# Patient Record
Sex: Female | Born: 1945 | Race: White | Hispanic: No | Smoking: Former smoker
Health system: Southern US, Community
[De-identification: ages and names within clinical notes are randomized; demographics above are authoritative.]

## PROBLEM LIST (undated history)

## (undated) DIAGNOSIS — I10 Essential (primary) hypertension: Secondary | ICD-10-CM

## (undated) DIAGNOSIS — C801 Malignant (primary) neoplasm, unspecified: Secondary | ICD-10-CM

## (undated) DIAGNOSIS — F909 Attention-deficit hyperactivity disorder, unspecified type: Secondary | ICD-10-CM

## (undated) HISTORY — PX: OTHER SURGICAL HISTORY: SHX169

## (undated) HISTORY — PX: ABDOMINAL HYSTERECTOMY: SHX81

## (undated) HISTORY — PX: TOTAL HIP ARTHROPLASTY: SHX124

---

## 2012-11-08 DIAGNOSIS — M25559 Pain in unspecified hip: Secondary | ICD-10-CM | POA: Insufficient documentation

## 2014-10-15 DIAGNOSIS — M545 Low back pain, unspecified: Secondary | ICD-10-CM | POA: Insufficient documentation

## 2014-10-15 DIAGNOSIS — G8929 Other chronic pain: Secondary | ICD-10-CM | POA: Insufficient documentation

## 2015-03-30 ENCOUNTER — Emergency Department: Payer: Medicare Other

## 2015-03-30 ENCOUNTER — Other Ambulatory Visit: Payer: Self-pay

## 2015-03-30 ENCOUNTER — Encounter: Payer: Self-pay | Admitting: Emergency Medicine

## 2015-03-30 DIAGNOSIS — R0602 Shortness of breath: Secondary | ICD-10-CM | POA: Insufficient documentation

## 2015-03-30 DIAGNOSIS — I10 Essential (primary) hypertension: Secondary | ICD-10-CM | POA: Insufficient documentation

## 2015-03-30 DIAGNOSIS — R918 Other nonspecific abnormal finding of lung field: Secondary | ICD-10-CM | POA: Diagnosis not present

## 2015-03-30 DIAGNOSIS — Z87891 Personal history of nicotine dependence: Secondary | ICD-10-CM | POA: Insufficient documentation

## 2015-03-30 DIAGNOSIS — R0600 Dyspnea, unspecified: Secondary | ICD-10-CM | POA: Insufficient documentation

## 2015-03-30 LAB — CBC
HCT: 34.1 % — ABNORMAL LOW (ref 35.0–47.0)
HEMOGLOBIN: 11.1 g/dL — AB (ref 12.0–16.0)
MCH: 26.9 pg (ref 26.0–34.0)
MCHC: 32.5 g/dL (ref 32.0–36.0)
MCV: 82.7 fL (ref 80.0–100.0)
PLATELETS: 458 10*3/uL — AB (ref 150–440)
RBC: 4.12 MIL/uL (ref 3.80–5.20)
RDW: 17.9 % — ABNORMAL HIGH (ref 11.5–14.5)
WBC: 12.8 10*3/uL — AB (ref 3.6–11.0)

## 2015-03-30 LAB — BASIC METABOLIC PANEL
ANION GAP: 3 — AB (ref 5–15)
BUN: 23 mg/dL — ABNORMAL HIGH (ref 6–20)
CHLORIDE: 109 mmol/L (ref 101–111)
CO2: 25 mmol/L (ref 22–32)
Calcium: 8.3 mg/dL — ABNORMAL LOW (ref 8.9–10.3)
Creatinine, Ser: 1.04 mg/dL — ABNORMAL HIGH (ref 0.44–1.00)
GFR calc Af Amer: >60 mL/min (ref >60)
GFR calc non Af Amer: 53 mL/min — ABNORMAL LOW (ref >60)
Glucose, Bld: 79 mg/dL (ref 65–99)
Potassium: 3.6 mmol/L (ref 3.5–5.1)
SODIUM: 137 mmol/L (ref 135–145)

## 2015-03-30 LAB — BRAIN NATRIURETIC PEPTIDE: B Natriuretic Peptide: 126 pg/mL — ABNORMAL HIGH (ref 0.0–100.0)

## 2015-03-30 LAB — TROPONIN I: Troponin I: 0.03 ng/mL (ref <0.031)

## 2015-03-30 NOTE — ED Notes (Signed)
Pt reports she was diagnosed with Pneumonia about 1 week ago with worsening SOB and chest pain. Currently taking Levaquin for Pneumonia bilateral lungs per pt.

## 2015-03-31 ENCOUNTER — Encounter: Payer: Self-pay | Admitting: Radiology

## 2015-03-31 ENCOUNTER — Emergency Department
Admission: EM | Admit: 2015-03-31 | Discharge: 2015-03-31 | Disposition: A | Discharge status: Home / Self Care | Payer: Medicare Other | Attending: Emergency Medicine | Admitting: Emergency Medicine

## 2015-03-31 ENCOUNTER — Emergency Department: Payer: Medicare Other

## 2015-03-31 DIAGNOSIS — R0602 Shortness of breath: Secondary | ICD-10-CM | POA: Diagnosis not present

## 2015-03-31 DIAGNOSIS — R0600 Dyspnea, unspecified: Secondary | ICD-10-CM

## 2015-03-31 DIAGNOSIS — R918 Other nonspecific abnormal finding of lung field: Secondary | ICD-10-CM

## 2015-03-31 HISTORY — DX: Attention-deficit hyperactivity disorder, unspecified type: F90.9

## 2015-03-31 HISTORY — DX: Essential (primary) hypertension: I10

## 2015-03-31 LAB — FIBRIN DERIVATIVES D-DIMER (ARMC ONLY): Fibrin derivatives D-dimer (ARMC): 1498 — ABNORMAL HIGH (ref 0–499)

## 2015-03-31 MED ORDER — IOHEXOL 350 MG/ML SOLN
75.0000 mL | Freq: Once | INTRAVENOUS | Status: AC | PRN
  Administered 2015-03-31: 75 mL via INTRAVENOUS

## 2015-03-31 MED ORDER — IOHEXOL 300 MG/ML  SOLN: 75.0000 mL | Freq: Once | INTRAMUSCULAR | Status: DC | PRN

## 2015-03-31 MED ORDER — KETOROLAC TROMETHAMINE 30 MG/ML IJ SOLN
30.0000 mg | Freq: Once | INTRAMUSCULAR | Status: AC
  Administered 2015-03-31: 30 mg via INTRAVENOUS

## 2015-03-31 MED ORDER — SODIUM CHLORIDE 0.9 % IV BOLUS (SEPSIS)
1000.0000 mL | Freq: Once | INTRAVENOUS | Status: AC
  Administered 2015-03-31: 1000 mL via INTRAVENOUS

## 2015-03-31 MED ORDER — KETOROLAC TROMETHAMINE 30 MG/ML IJ SOLN
INTRAMUSCULAR | Status: AC
  Filled 2015-03-31: qty 1

## 2015-03-31 NOTE — Discharge Instructions (Signed)

## 2015-03-31 NOTE — ED Notes (Signed)
Pt provided with extra blankets for comfort. Call bell at side.

## 2015-03-31 NOTE — ED Provider Notes (Signed)
[facility] Emergency Department Provider Note  ____________________________________________  Time seen: Approximately 0055 AM  I have reviewed the triage vital signs and the nursing notes.   HISTORY  Chief Complaint Shortness of Breath   HPI Marie Dunn is a 69 y.o. female who comes in with a double pneumonia 10 days. The patient reports she did around of antibiotics and then did another round of antibiotics. The patient reports that she was told by her physician if the symptoms did not improve that she should come into the emergency department. The patient reports she is worsened over the last couple of days. She had fever cough and been unable to get out of bed. The patient does not remit name of the medicine that she received initially but her last course of antibiotics was level floxacillin. The patient reports that she was seen at the [facility] practice for treatment. She reports that she also hurts all over. She denies any chest pain but says she is unable to breathe well. She has not taken her temperature home but reports it was 99 at the doctor's office. The patient came in for further treatment and evaluation.   Past Medical History  Diagnosis Date  . ADHD (attention deficit hyperactivity disorder)   . Hypertension     There are no active problems to display for this patient.   Past Surgical History  Procedure Laterality Date  . Abdominal hysterectomy    . Left hip replacement    . Left ankle surgery      No current outpatient prescriptions on file.  Allergies Ampicillin  No family history on file.  Social History History  Substance Use Topics  . Smoking status: Former Research scientist (life sciences)  . Smokeless tobacco: Never Used  . Alcohol Use: No    Review of Systems Constitutional: fever/chills Eyes: No visual changes. ENT: No sore throat. Cardiovascular: Denies chest pain. Respiratory:  shortness of breath. Gastrointestinal: Nausea with No  abdominal pain.   no vomiting.   Genitourinary: Negative for dysuria. Musculoskeletal: Negative for back pain. Skin: Negative for rash. Neurological: Negative for headaches,  10-point ROS otherwise negative.  ____________________________________________   PHYSICAL EXAM:  VITAL SIGNS: ED Triage Vitals  Enc Vitals Group     BP 03/30/15 1905 113/69 mmHg     Pulse Rate 03/30/15 1905 101     Resp 03/30/15 1905 26     Temp 03/30/15 1905 98.2 F (36.8 C)     Temp Source 03/30/15 1905 Oral     SpO2 03/30/15 1905 96 %     Weight 03/30/15 1905 140 lb (63.504 kg)     Height 03/30/15 1905 '5\' 3"'$  (1.6 m)     Head Cir --      Peak Flow --      Pain Score 03/30/15 1906 10     Pain Loc --      Pain Edu? --      Excl. in [place]? --     Constitutional: Alert and oriented. Well appearing and in mild distress. Eyes: Conjunctivae are normal. PERRL. EOMI. Head: Atraumatic. Nose: No congestion/rhinnorhea. Mouth/Throat: Mucous membranes are moist.  Oropharynx non-erythematous. Cardiovascular: Normal rate, regular rhythm. Grossly normal heart sounds.  Good peripheral circulation. Respiratory: Normal respiratory effort.  No retractions. Crackles in bilateral bases Gastrointestinal: Soft and nontender. No distention. Positive bowel sounds Genitourinary: Deferred Musculoskeletal: No lower extremity tenderness nor edema.   Neurologic:  Normal speech and language. No gross focal neurologic deficits are appreciated.  No gait  instability. Skin:  Skin is warm, dry and intact. No rash noted. Psychiatric: Mood and affect are normal.   ____________________________________________   LABS (all labs ordered are listed, but only abnormal results are displayed)  Labs Reviewed  CBC - Abnormal; Notable for the following:    WBC 12.8 (*)    Hemoglobin 11.1 (*)    HCT 34.1 (*)    RDW 17.9 (*)    Platelets 458 (*)    All other components within normal limits  BASIC METABOLIC PANEL - Abnormal; Notable for the  following:    BUN 23 (*)    Creatinine, Ser 1.04 (*)    Calcium 8.3 (*)    GFR calc non Af Amer 53 (*)    Anion gap 3 (*)    All other components within normal limits  BRAIN NATRIURETIC PEPTIDE - Abnormal; Notable for the following:    B Natriuretic Peptide 126.0 (*)    All other components within normal limits  FIBRIN DERIVATIVES D-DIMER ([facility] ONLY) - Abnormal; Notable for the following:    Fibrin derivatives D-dimer ([facility]) 1498 (*)    All other components within normal limits  BLOOD GAS, VENOUS - Abnormal; Notable for the following:    pH, Ven 7.44 (*)    pCO2, Ven 35 (*)    All other components within normal limits  TROPONIN I   ____________________________________________  EKG  ED ECG REPORT I, Loney Hering, the attending physician, personally viewed and interpreted this ECG.   Date: 03/31/2015  EKG Time: 1924  Rate: 100  Rhythm: normal sinus rhythm  Axis: Normal  Intervals:none  ST&T Change: None  ____________________________________________  RADIOLOGY  Chest x-ray: Minimal bibasilar atelectasis or scarring, borderline cardiomegaly and mild changes of COPD  CT angiography chest: No evidence of significant pulmonary embolus, diffuse emphysematous changes and peripheral fibrosis/scarring in the lungs. 11 mm spiculated nodule in the inferior right upper lobe. Malignancy should be excluded ____________________________________________   PROCEDURES  Procedure(s) performed: None  Critical Care performed: No  ____________________________________________   INITIAL IMPRESSION / ASSESSMENT AND PLAN / ED COURSE  Pertinent labs & imaging results that were available during my care of the patient were reviewed by me and considered in my medical decision making (see chart for details).  This is a 69 year old female who comes in with pneumonia she reports for 10 days. I will look at the patient's old history to determine what medication she's been on and I will do  a CT of the patient's chest looking for other causes of her shortness of breath symptoms.  ----------------------------------------- 7:45 AM on 03/31/2015 -----------------------------------------  I did inform the patient of the results of the CT scan. The patient does not have any significantly elevated PCO2 she does not have a pneumonia on her x-ray. The patient does have some fibrosis with some mild basilar crackles but she does not have any  wheezes or other signs of COPD. I will discharge the patient home to have her follow back up with pulmonology to evaluate the cause of her shortness of breath. Otherwise the patient has no other concerns. Mom the emergency department her O2 sats have been in the mid to high 90s. I discussed this with the patient and she'll be discharged home. ____________________________________________   FINAL CLINICAL IMPRESSION(S) / ED DIAGNOSES  Final diagnoses:  SOB (shortness of breath)  Pulmonary mass  Dyspnea      Loney Hering, MD 03/31/15 [phone]

## 2015-03-31 NOTE — ED Notes (Signed)
Pt continues to sleep, resps unlabored.

## 2015-03-31 NOTE — ED Notes (Signed)
Spoke with son, states " I will be there in 45 min to come and get her".

## 2015-03-31 NOTE — ED Notes (Signed)
Pt sleeping. 

## 2015-03-31 NOTE — ED Notes (Signed)
Pt's son's phone number, [name], [phone]

## 2015-03-31 NOTE — ED Notes (Signed)
Pt states "i have double pneumonia again, i just know it, just don't do anything else, get the doctor in here in a hurry and get me upstairs." pt refusing to roll over and let rn listen to breath sounds, pt refusing to change into hospital gown. Pt's son in room, son states "mom, come on and do what the nurse is asking you to do, stop giving everyone such a hard time." pt then states "i'm being a bitch to you, i'm sorry". Pt then is cooperative with exam.

## 2015-03-31 NOTE — ED Notes (Signed)
Report to jennifer, rn.  

## 2015-04-03 LAB — BLOOD GAS, VENOUS
Acid-Base Excess: 0.1 mmol/L (ref 0.0–3.0)
BICARBONATE: 23.8 meq/L (ref 21.0–28.0)
FIO2: 0.21 %
PATIENT TEMPERATURE: 37
PH VEN: 7.44 — AB (ref 7.320–7.430)
pCO2, Ven: 35 mmHg — ABNORMAL LOW (ref 44.0–60.0)

## 2015-04-17 ENCOUNTER — Encounter: Payer: Self-pay | Admitting: Emergency Medicine

## 2015-04-17 ENCOUNTER — Emergency Department: Payer: Medicare Other

## 2015-04-17 ENCOUNTER — Emergency Department
Admission: EM | Admit: 2015-04-17 | Discharge: 2015-04-17 | Disposition: A | Discharge status: Home / Self Care | Payer: Medicare Other | Attending: Emergency Medicine | Admitting: Emergency Medicine

## 2015-04-17 DIAGNOSIS — Z87891 Personal history of nicotine dependence: Secondary | ICD-10-CM | POA: Insufficient documentation

## 2015-04-17 DIAGNOSIS — E876 Hypokalemia: Secondary | ICD-10-CM | POA: Insufficient documentation

## 2015-04-17 DIAGNOSIS — Z79899 Other long term (current) drug therapy: Secondary | ICD-10-CM | POA: Insufficient documentation

## 2015-04-17 DIAGNOSIS — I10 Essential (primary) hypertension: Secondary | ICD-10-CM | POA: Insufficient documentation

## 2015-04-17 DIAGNOSIS — IMO0001 Reserved for inherently not codable concepts without codable children: Secondary | ICD-10-CM

## 2015-04-17 DIAGNOSIS — R799 Abnormal finding of blood chemistry, unspecified: Secondary | ICD-10-CM | POA: Diagnosis present

## 2015-04-17 DIAGNOSIS — J441 Chronic obstructive pulmonary disease with (acute) exacerbation: Secondary | ICD-10-CM | POA: Diagnosis not present

## 2015-04-17 DIAGNOSIS — Z880 Allergy status to penicillin: Secondary | ICD-10-CM | POA: Insufficient documentation

## 2015-04-17 LAB — COMPREHENSIVE METABOLIC PANEL
ALK PHOS: 79 U/L (ref 38–126)
ALT: 58 U/L — AB (ref 14–54)
ANION GAP: 14 (ref 5–15)
AST: 80 U/L — AB (ref 15–41)
Albumin: 4 g/dL (ref 3.5–5.0)
BUN: 10 mg/dL (ref 6–20)
CO2: 25 mmol/L (ref 22–32)
Calcium: 10.1 mg/dL (ref 8.9–10.3)
Chloride: 101 mmol/L (ref 101–111)
Creatinine, Ser: 0.78 mg/dL (ref 0.44–1.00)
Glucose, Bld: 132 mg/dL — ABNORMAL HIGH (ref 65–99)
POTASSIUM: 2.9 mmol/L — AB (ref 3.5–5.1)
SODIUM: 140 mmol/L (ref 135–145)
TOTAL PROTEIN: 7.9 g/dL (ref 6.5–8.1)
Total Bilirubin: 0.5 mg/dL (ref 0.3–1.2)

## 2015-04-17 MED ORDER — POTASSIUM CHLORIDE CRYS ER 20 MEQ PO TBCR
40.0000 meq | EXTENDED_RELEASE_TABLET | Freq: Once | ORAL | Status: AC
Start: 2015-04-17 — End: 2015-04-17
  Administered 2015-04-17: 40 meq via ORAL

## 2015-04-17 MED ORDER — ALBUTEROL SULFATE HFA 108 (90 BASE) MCG/ACT IN AERS: 2.0000 | INHALATION_SPRAY | Freq: Four times a day (QID) | RESPIRATORY_TRACT | Status: DC | PRN

## 2015-04-17 MED ORDER — POTASSIUM CHLORIDE CRYS ER 20 MEQ PO TBCR
EXTENDED_RELEASE_TABLET | ORAL | Status: AC
  Administered 2015-04-17: 40 meq via ORAL
  Filled 2015-04-17: qty 2

## 2015-04-17 MED ORDER — POTASSIUM CHLORIDE ER 20 MEQ PO TBCR: 10.0000 meq | EXTENDED_RELEASE_TABLET | Freq: Every day | ORAL | Status: DC

## 2015-04-17 NOTE — Discharge Instructions (Signed)
Your potassium was 2.9 today. You were treated with 40 mEq of potassium in the emergency department by mouth. Take 20 mEq pill once a day for the next 5 days. You did not tolerate the albuterol nebulization treatments. We are prescribing albuterol MDI. Take 2-4 puffs as needed for any tightness. Use a spacer with the inhaler. Follow-up with your regular doctor for reexamination and ongoing care.  Chronic Asthmatic Bronchitis Chronic asthmatic bronchitis is a complication of persistent asthma. After a period of time with asthma, some people develop airflow obstruction that is present all the time, even when not having an asthma attack.There is also persistent inflammation of the airways, and the bronchial tubes produce more mucus. Chronic asthmatic bronchitis usually is a permanent problem with the lungs. CAUSES  Chronic asthmatic bronchitis happens most often in people who have asthma and also smoke cigarettes. Occasionally, it can happen to a person with long-standing or severe asthma even if the person is not a smoker. SIGNS AND SYMPTOMS  Chronic asthmatic bronchitis usually causes symptoms of both asthma and chronic bronchitis, including:   Coughing.  Increased sputum production.  Wheezing and shortness of breath.  Chest discomfort.  Recurring infections. DIAGNOSIS  Your health care provider will take a medical history and perform a physical exam. Chronic asthmatic bronchitis is suspected when a person with asthma has abnormal results on breathing tests (pulmonary function tests) even when breathing symptoms are at their best. Other tests, such as a chest X-ray, may be performed to rule out other conditions.  TREATMENT  Treatment involves controlling symptoms with medicine and lifestyle changes.  Your health care provider may prescribe asthma medicines, including inhaler and nebulizer medicines.  Infection can be treated with medicine to kill germs (antibiotics). Serious infections may  require hospitalization. These can include:  Pneumonia.  Sinus infections.  Acute bronchitis.   Preventing infection and hospitalization is very important. Get an influenza vaccination every year as directed by your health care provider. Ask your health care provider whether you need a pneumonia vaccine.  Ask your health care provider whether you would benefit from a pulmonary rehabilitation program. HOME CARE INSTRUCTIONS  Take medicines only as directed by your health care provider.  If you are a cigarette smoker, the most important thing that you can do is quit. Talk to your health care provider for help with quitting smoking.  Avoid pollen, dust, animal dander, molds, smoke, and other things that cause attacks.  Regular exercise is very important to help you feel better. Discuss possible exercise routines with your health care provider.  If animal dander is the cause of asthma, you may not be able to keep pets.  It is important that you:  Become educated about your medical condition.  Participate in maintaining wellness.  Seek medical care as directed. Delay in seeking medical care could cause permanent injury and may be a risk to your life. SEEK MEDICAL CARE IF:  You have wheezing and shortness of breath even if taking medicine to prevent attacks.  You have muscle aches, chest pain, or thickening of sputum.  Your sputum changes from clear or white to yellow, green, gray, or bloody. SEEK IMMEDIATE MEDICAL CARE IF:  Your usual medicines do not stop your wheezing.  You have increased coughing or shortness of breath or both.  You have increased difficulty breathing.  You have any problems from the medicine you are taking, such as a rash, itching, swelling, or trouble breathing. MAKE SURE YOU:   Understand these  instructions.  Will watch your condition.  Will get help right away if you are not doing well or get worse. Document Released: 07/16/2006 Document  Revised: 02/12/2014 Document Reviewed: 11/06/2013 [facility] Patient Information 2015 [place], [place]. This information is not intended to replace advice given to you by your health care provider. Make sure you discuss any questions you have with your health care provider.

## 2015-04-17 NOTE — ED Notes (Signed)
Pt sent over from [facility] for further evaluation of having a low potassium level.

## 2015-04-17 NOTE — ED Provider Notes (Signed)
[facility] Emergency Department Provider Note  ____________________________________________  Time seen: 11:20 AM  I have reviewed the triage vital signs and the nursing notes.   HISTORY  Chief Complaint Abnormal Lab     HPI Marie Dunn is a 69 y.o. female who is been sent to the emergency department by her primary physician due to a lab value from a blood sample that was drawn yesterday. Her potassium apparently is 2.7. The doctor's office left a number of messages to tell her to go to the emergency department.   The patient reports she has not been feeling well for a month or 2. She feels fatigued. She has been having some shortness of breath and difficulty breathing. She has been seen and treated for this. Initially she was treated for "double pneumonia" with Levaquin. She was in the emergency department recently, June 18, and had a fairly normal chest x-ray. She had a positive d-dimer and received a chest CT that did not see a pulmonary emboli. She did have a nodule, 11 mm spiculated, and subsequently had a PET scan recently which was negative for mass.  Her heart rate is a little elevated on exam. She reports her heart rate is always high. She does take medicines for ADHD and took some this morning.    Past Medical History  Diagnosis Date  . ADHD (attention deficit hyperactivity disorder)   . Hypertension     There are no active problems to display for this patient.   Past Surgical History  Procedure Laterality Date  . Abdominal hysterectomy    . Left hip replacement    . Left ankle surgery      Current Outpatient Rx  Name  Route  Sig  Dispense  Refill  . dexmethylphenidate (FOCALIN XR) 20 MG 24 hr capsule   Oral   Take 20 mg by mouth daily.      0   . hydrochlorothiazide (HYDRODIURIL) 25 MG tablet   Oral   Take 25 mg by mouth daily. for blood pressure      11   . SPIRIVA HANDIHALER 18 MCG inhalation capsule      PLACE 1 CAPSULE  (18 MCG TOTAL) INTO INHALER AND INHALE ONCE DAILY.      11     Dispense as written.   Marland Kitchen albuterol (PROVENTIL HFA;VENTOLIN HFA) 108 (90 BASE) MCG/ACT inhaler   Inhalation   Inhale 2 puffs into the lungs every 6 (six) hours as needed for wheezing or shortness of breath.   1 Inhaler   2   . potassium chloride 20 MEQ TBCR   Oral   Take 10 mEq by mouth daily.   5 tablet   0     Allergies Ampicillin  No family history on file.  Social History History  Substance Use Topics  . Smoking status: Former Research scientist (life sciences)  . Smokeless tobacco: Never Used  . Alcohol Use: No    Review of Systems  Constitutional: Negative for fever. ENT: Negative for sore throat.  Cardiovascular: Negative for chest pain. Respiratory: shortness of breath, cough. Gastrointestinal: Negative for abdominal pain, vomiting and diarrhea. Genitourinary: Negative for dysuria. Musculoskeletal: No myalgias or injuries. Skin: Negative for rash. Neurological: Negative for headaches   10-point ROS otherwise negative.  ____________________________________________   PHYSICAL EXAM:  VITAL SIGNS: ED Triage Vitals  Enc Vitals Group     BP 04/17/15 1021 166/116 mmHg     Pulse Rate 04/17/15 1021 108     Resp 04/17/15  1021 20     Temp 04/17/15 1021 97.7 F (36.5 C)     Temp Source 04/17/15 1021 Oral     SpO2 04/17/15 1021 97 %     Weight 04/17/15 1021 145 lb (65.772 kg)     Height 04/17/15 1021 '5\' 3"'$  (1.6 m)     Head Cir --      Peak Flow --      Pain Score 04/17/15 1022 0     Pain Loc --      Pain Edu? --      Excl. in [place]? --     Constitutional: Alert and oriented. Well appearing and in no distress. Patient is quite attentive. ENT   Head: Normocephalic and atraumatic.   Nose: No congestion/rhinnorhea.   Mouth/Throat: Mucous membranes are moist. Cardiovascular: tachycardia at 108, regular rhythm, no murmur noted Respiratory:  Normal respiratory effort, no tachypnea.    Mild coarse breath sounds  noticed. Gastrointestinal: Soft and nontender. No distention.  Back: No muscle spasm, no tenderness, no CVA tenderness. Musculoskeletal: No deformity noted. Nontender with normal range of motion in all extremities.  No noted edema. Neurologic:  Normal speech and language. No gross focal neurologic deficits are appreciated.  Skin:  Skin is warm, dry. No rash noted. Psychiatric: Mood and affect are normal. Speech and behavior are normal.  ____________________________________________    LABS (pertinent positives/negatives)  Labs Reviewed  COMPREHENSIVE METABOLIC PANEL - Abnormal; Notable for the following:    Potassium 2.9 (*)    Glucose, Bld 132 (*)    AST 80 (*)    ALT 58 (*)    All other components within normal limits     ____________________________________________ ________________________________    RADIOLOGY  Chest x-ray: IMPRESSION: 1. COPD. Improved aeration of the lung bases without evidence of acute airspace disease. 2. Hiatal hernia.   ____________________________________________ ____________________________________________   INITIAL IMPRESSION / ASSESSMENT AND PLAN / ED COURSE  Pertinent labs & imaging results that were available during my care of the patient were reviewed by me and considered in my medical decision making (see chart for details).  Overall well-appearing 69 year old female in no acute distress. She does have a little bit of a fast heart rate at 108. She reports this is not uncommon for her. She is not having any difficult breathing.  Patient is concerned that she may have pneumonia again. Her doctor yesterday wrote her for some antibiotics just in case, so that she would be able to spend more time with her granddaughter. The patient and I have discussed the pros and cons of a second round of antibiotics for her. We will obtain an x-ray at this time for further evaluation.  ----------------------------------------- 12:22 PM on  04/17/2015 -----------------------------------------  Chest x-ray looks reasonable without sign of infiltrate. Potassium is 2.9. We have given the patient 40 mEq of potassium by mouth. I will prescribe 20 meq a day for the next 4 days and have her recheck with her primary physician. The patient and I have further discussed her respiratory dictation's. She had been prescribed a nebulized treatment recently which she said kept her awake and she has not been using it. We have talked about using an MDI instead which we'll give her approximately one 12th the dose. Patient agrees to this.  Patient's heart rate is still slightly elevated. The patient again reassures me that that is not abnormal for her. ____________________________________________   FINAL CLINICAL IMPRESSION(S) / ED DIAGNOSES  Final diagnoses:  Hypokalemia  COPD bronchitis      Ahmed Prima, MD 04/17/15 [phone]

## 2015-04-17 NOTE — ED Notes (Signed)
Pt sent from [facility] for abnormal blood work, pt had blood drawn yesterday, pt states she was scheduled for a chest xray today but never made it, pt states recent diagnosis of pneumonia and abx course, pt states increased SOB with exertion, upon assessment pt demonstrates increased work of breathing upon assessment

## 2015-08-06 ENCOUNTER — Encounter: Payer: Self-pay | Admitting: Family Medicine

## 2015-08-06 ENCOUNTER — Ambulatory Visit (INDEPENDENT_AMBULATORY_CARE_PROVIDER_SITE_OTHER): Payer: Medicare Other | Admitting: Family Medicine

## 2015-08-06 VITALS — BP 160/80 | HR 98 | Resp 16 | Ht 63.0 in | Wt 150.0 lb

## 2015-08-06 DIAGNOSIS — F320 Major depressive disorder, single episode, mild: Secondary | ICD-10-CM | POA: Insufficient documentation

## 2015-08-06 DIAGNOSIS — F900 Attention-deficit hyperactivity disorder, predominantly inattentive type: Secondary | ICD-10-CM

## 2015-08-06 DIAGNOSIS — Z7689 Persons encountering health services in other specified circumstances: Secondary | ICD-10-CM | POA: Insufficient documentation

## 2015-08-06 DIAGNOSIS — Z7189 Other specified counseling: Secondary | ICD-10-CM

## 2015-08-06 DIAGNOSIS — M1990 Unspecified osteoarthritis, unspecified site: Secondary | ICD-10-CM | POA: Insufficient documentation

## 2015-08-06 DIAGNOSIS — F32A Depression, unspecified: Secondary | ICD-10-CM | POA: Insufficient documentation

## 2015-08-06 DIAGNOSIS — I10 Essential (primary) hypertension: Secondary | ICD-10-CM | POA: Diagnosis not present

## 2015-08-06 DIAGNOSIS — F988 Other specified behavioral and emotional disorders with onset usually occurring in childhood and adolescence: Secondary | ICD-10-CM | POA: Insufficient documentation

## 2015-08-06 MED ORDER — LISINOPRIL 20 MG PO TABS: 20.0000 mg | ORAL_TABLET | Freq: Every day | ORAL | Status: DC

## 2015-08-06 MED ORDER — HYDROCHLOROTHIAZIDE 25 MG PO TABS: 25.0000 mg | ORAL_TABLET | Freq: Every day | ORAL | Status: DC

## 2015-08-06 NOTE — Progress Notes (Signed)
Name: Marie Dunn   MRN: [id number]    DOB: Mar 14, 1946   Date:08/06/2015       Progress Note  Subjective  Chief Complaint  Chief Complaint  Patient presents with  . Establish Care    HPI  Here to establish care.  Has hx./ of HBP and ADHD.  Off ADHD  meds for several months.  Also has HBP.  Taking meds. She is a recovering alcoholic and drug user.  No recreational drugs for 35 yrs.   No problem-specific assessment & plan notes found for this encounter.   Past Medical History  Diagnosis Date  . ADHD (attention deficit hyperactivity disorder)   . Hypertension     Past Surgical History  Procedure Laterality Date  . Abdominal hysterectomy    . Left hip replacement    . Left ankle surgery      History reviewed. No pertinent family history.  Social History   Social History  . Marital Status: Divorced    Spouse Name: N/A  . Number of Children: N/A  . Years of Education: N/A   Occupational History  . Not on file.   Social History Main Topics  . Smoking status: Former Research scientist (life sciences)  . Smokeless tobacco: Never Used  . Alcohol Use: No  . Drug Use: No  . Sexual Activity: Not on file   Other Topics Concern  . Not on file   Social History Narrative     Current outpatient prescriptions:  .  hydrochlorothiazide (HYDRODIURIL) 25 MG tablet, Take 25 mg by mouth daily. for blood pressure, Disp: , Rfl: 11 .  lisinopril (PRINIVIL,ZESTRIL) 10 MG tablet, , Disp: , Rfl:   Allergies  Allergen Reactions  . Ampicillin Anaphylaxis  . Codeine Other (See Comments)  . Morphine And Related Rash    Other Reaction: Intolerance  . Amoxicillin Swelling  . Prednisone Swelling     Review of Systems  Constitutional: Negative for fever, chills, weight loss and malaise/fatigue.  HENT: Negative for hearing loss.   Eyes: Negative for blurred vision and double vision.  Respiratory: Negative for cough, sputum production, shortness of breath and wheezing.   Cardiovascular: Negative for  chest pain, palpitations, orthopnea and leg swelling.  Gastrointestinal: Negative for heartburn, abdominal pain and blood in stool.  Genitourinary: Negative for dysuria, urgency and frequency.  Musculoskeletal: Negative for myalgias and joint pain.  Skin: Negative for rash.  Neurological: Negative for weakness and headaches.  Psychiatric/Behavioral:       Very talkative .  Hyperactive.      Objective  Filed Vitals:   08/06/15 1025  BP: 162/83  Pulse: 98  Resp: 16  Height: '5\' 3"'$  (1.6 m)  Weight: 150 lb (68.04 kg)    Physical Exam  Constitutional: She is oriented to person, place, and time and well-developed, well-nourished, and in no distress. No distress.  HENT:  Head: Normocephalic and atraumatic.  Eyes: Conjunctivae and EOM are normal. Pupils are equal, round, and reactive to light. No scleral icterus.  Neck: Normal range of motion. Neck supple. Carotid bruit is not present. No thyromegaly present.  Cardiovascular: Normal rate, regular rhythm, normal heart sounds and intact distal pulses.  Exam reveals no gallop and no friction rub.   No murmur heard. Pulmonary/Chest: Effort normal and breath sounds normal. No respiratory distress. She has no wheezes. She has no rales.  Abdominal: Soft. She exhibits no distension and no mass. There is no tenderness.  Musculoskeletal: Normal range of motion. She exhibits no edema.  Lymphadenopathy:  She has no cervical adenopathy.  Neurological: She is alert and oriented to person, place, and time.  Psychiatric:  Very talkative.  Hyperactive.  Hard to focus.  Vitals reviewed.      No results found for this or any previous visit (from the past 2160 hour(s)).   Assessment & Plan  Problem List Items Addressed This Visit    None      Meds ordered this encounter  Medications  . DISCONTD: lisinopril (PRINIVIL,ZESTRIL) 10 MG tablet    Sig: Take by mouth.  . DISCONTD: traMADol (ULTRAM) 50 MG tablet    Sig: Take by mouth.  .  DISCONTD: valACYclovir (VALTREX) 1000 MG tablet    Sig: Take by mouth.  . DISCONTD: hydrochlorothiazide (HYDRODIURIL) 25 MG tablet    Sig: Take 1 Tablet by mouth daily for blood pressure  . DISCONTD: potassium chloride (K-DUR) 10 MEQ tablet    Sig: Take by mouth.  . DISCONTD: albuterol (PROAIR HFA) 108 (90 BASE) MCG/ACT inhaler    Sig:   . DISCONTD: methylphenidate (RITALIN LA) 20 MG 24 hr capsule    Sig: Take 20 mg by mouth.  . DISCONTD: dexmethylphenidate (FOCALIN XR) 10 MG 24 hr capsule    Sig: Take 10 mg by mouth daily.    Refill:  0  . lisinopril (PRINIVIL,ZESTRIL) 10 MG tablet    Sig:   . DISCONTD: traMADol (ULTRAM) 50 MG tablet    Sig: TAKE 1/2 TO 1 TABLET BY MOUTH EVERY 6 HOURS AS NEEDED FOR PAIN    Refill:  0   1. Encounter to establish care   2. Adult attention deficit disorder  - Ambulatory referral to Psychiatry  3. Essential hypertension

## 2015-08-06 NOTE — Patient Instructions (Signed)
Contact [facility] today.

## 2015-09-10 ENCOUNTER — Ambulatory Visit: Payer: Medicare Other | Admitting: Family Medicine

## 2015-10-31 DIAGNOSIS — Z8639 Personal history of other endocrine, nutritional and metabolic disease: Secondary | ICD-10-CM | POA: Diagnosis not present

## 2015-10-31 DIAGNOSIS — M1611 Unilateral primary osteoarthritis, right hip: Secondary | ICD-10-CM | POA: Diagnosis not present

## 2015-10-31 DIAGNOSIS — Z96642 Presence of left artificial hip joint: Secondary | ICD-10-CM | POA: Diagnosis not present

## 2015-10-31 DIAGNOSIS — J449 Chronic obstructive pulmonary disease, unspecified: Secondary | ICD-10-CM | POA: Diagnosis not present

## 2015-11-05 DIAGNOSIS — Z96641 Presence of right artificial hip joint: Secondary | ICD-10-CM | POA: Diagnosis not present

## 2015-11-05 DIAGNOSIS — Z87891 Personal history of nicotine dependence: Secondary | ICD-10-CM | POA: Diagnosis not present

## 2015-11-05 DIAGNOSIS — Z96642 Presence of left artificial hip joint: Secondary | ICD-10-CM | POA: Diagnosis not present

## 2015-11-05 DIAGNOSIS — Z79899 Other long term (current) drug therapy: Secondary | ICD-10-CM | POA: Diagnosis not present

## 2015-11-05 DIAGNOSIS — J449 Chronic obstructive pulmonary disease, unspecified: Secondary | ICD-10-CM | POA: Diagnosis not present

## 2015-11-05 DIAGNOSIS — Z471 Aftercare following joint replacement surgery: Secondary | ICD-10-CM | POA: Diagnosis not present

## 2015-11-05 DIAGNOSIS — M1611 Unilateral primary osteoarthritis, right hip: Secondary | ICD-10-CM | POA: Diagnosis not present

## 2015-11-06 DIAGNOSIS — Z96642 Presence of left artificial hip joint: Secondary | ICD-10-CM | POA: Diagnosis not present

## 2015-11-06 DIAGNOSIS — J449 Chronic obstructive pulmonary disease, unspecified: Secondary | ICD-10-CM | POA: Diagnosis not present

## 2015-11-06 DIAGNOSIS — M1611 Unilateral primary osteoarthritis, right hip: Secondary | ICD-10-CM | POA: Diagnosis not present

## 2015-11-06 DIAGNOSIS — Z79899 Other long term (current) drug therapy: Secondary | ICD-10-CM | POA: Diagnosis not present

## 2015-11-06 DIAGNOSIS — G8918 Other acute postprocedural pain: Secondary | ICD-10-CM | POA: Diagnosis not present

## 2015-11-06 DIAGNOSIS — Z87891 Personal history of nicotine dependence: Secondary | ICD-10-CM | POA: Diagnosis not present

## 2015-11-07 DIAGNOSIS — Z79899 Other long term (current) drug therapy: Secondary | ICD-10-CM | POA: Diagnosis not present

## 2015-11-07 DIAGNOSIS — M25551 Pain in right hip: Secondary | ICD-10-CM | POA: Diagnosis not present

## 2015-11-07 DIAGNOSIS — M150 Primary generalized (osteo)arthritis: Secondary | ICD-10-CM | POA: Diagnosis not present

## 2015-11-07 DIAGNOSIS — Z96641 Presence of right artificial hip joint: Secondary | ICD-10-CM | POA: Diagnosis not present

## 2015-11-07 DIAGNOSIS — I10 Essential (primary) hypertension: Secondary | ICD-10-CM | POA: Diagnosis not present

## 2015-11-07 DIAGNOSIS — R262 Difficulty in walking, not elsewhere classified: Secondary | ICD-10-CM | POA: Diagnosis not present

## 2015-11-07 DIAGNOSIS — Z471 Aftercare following joint replacement surgery: Secondary | ICD-10-CM | POA: Diagnosis not present

## 2015-11-07 DIAGNOSIS — J439 Emphysema, unspecified: Secondary | ICD-10-CM | POA: Diagnosis not present

## 2015-11-07 DIAGNOSIS — M6250 Muscle wasting and atrophy, not elsewhere classified, unspecified site: Secondary | ICD-10-CM | POA: Diagnosis not present

## 2015-11-07 DIAGNOSIS — Z96642 Presence of left artificial hip joint: Secondary | ICD-10-CM | POA: Diagnosis not present

## 2015-11-07 DIAGNOSIS — Z96649 Presence of unspecified artificial hip joint: Secondary | ICD-10-CM | POA: Diagnosis not present

## 2015-11-07 DIAGNOSIS — F339 Major depressive disorder, recurrent, unspecified: Secondary | ICD-10-CM | POA: Diagnosis not present

## 2015-11-07 DIAGNOSIS — Z87891 Personal history of nicotine dependence: Secondary | ICD-10-CM | POA: Diagnosis not present

## 2015-11-07 DIAGNOSIS — G8918 Other acute postprocedural pain: Secondary | ICD-10-CM | POA: Diagnosis not present

## 2015-11-07 DIAGNOSIS — B182 Chronic viral hepatitis C: Secondary | ICD-10-CM | POA: Diagnosis not present

## 2015-11-07 DIAGNOSIS — M545 Low back pain: Secondary | ICD-10-CM | POA: Diagnosis not present

## 2015-11-07 DIAGNOSIS — M1611 Unilateral primary osteoarthritis, right hip: Secondary | ICD-10-CM | POA: Diagnosis not present

## 2015-11-07 DIAGNOSIS — M6281 Muscle weakness (generalized): Secondary | ICD-10-CM | POA: Diagnosis not present

## 2015-11-07 DIAGNOSIS — J449 Chronic obstructive pulmonary disease, unspecified: Secondary | ICD-10-CM | POA: Diagnosis not present

## 2015-11-13 DIAGNOSIS — M150 Primary generalized (osteo)arthritis: Secondary | ICD-10-CM | POA: Diagnosis not present

## 2015-11-13 DIAGNOSIS — J449 Chronic obstructive pulmonary disease, unspecified: Secondary | ICD-10-CM | POA: Diagnosis not present

## 2015-11-18 DIAGNOSIS — Z96641 Presence of right artificial hip joint: Secondary | ICD-10-CM | POA: Diagnosis not present

## 2015-11-21 DIAGNOSIS — M1611 Unilateral primary osteoarthritis, right hip: Secondary | ICD-10-CM | POA: Diagnosis not present

## 2015-11-21 DIAGNOSIS — Z96642 Presence of left artificial hip joint: Secondary | ICD-10-CM | POA: Diagnosis not present

## 2015-11-21 DIAGNOSIS — Z96641 Presence of right artificial hip joint: Secondary | ICD-10-CM | POA: Diagnosis not present

## 2015-11-25 DIAGNOSIS — I10 Essential (primary) hypertension: Secondary | ICD-10-CM | POA: Diagnosis present

## 2015-11-25 DIAGNOSIS — R262 Difficulty in walking, not elsewhere classified: Secondary | ICD-10-CM | POA: Diagnosis not present

## 2015-11-25 DIAGNOSIS — J449 Chronic obstructive pulmonary disease, unspecified: Secondary | ICD-10-CM | POA: Diagnosis present

## 2015-11-25 DIAGNOSIS — Z23 Encounter for immunization: Secondary | ICD-10-CM | POA: Diagnosis not present

## 2015-11-25 DIAGNOSIS — F909 Attention-deficit hyperactivity disorder, unspecified type: Secondary | ICD-10-CM | POA: Diagnosis present

## 2015-11-25 DIAGNOSIS — B1920 Unspecified viral hepatitis C without hepatic coma: Secondary | ICD-10-CM | POA: Diagnosis not present

## 2015-11-25 DIAGNOSIS — Z96641 Presence of right artificial hip joint: Secondary | ICD-10-CM | POA: Diagnosis not present

## 2015-11-25 DIAGNOSIS — T814XXD Infection following a procedure, subsequent encounter: Secondary | ICD-10-CM | POA: Diagnosis not present

## 2015-11-25 DIAGNOSIS — F329 Major depressive disorder, single episode, unspecified: Secondary | ICD-10-CM | POA: Diagnosis not present

## 2015-11-25 DIAGNOSIS — D649 Anemia, unspecified: Secondary | ICD-10-CM | POA: Diagnosis present

## 2015-11-25 DIAGNOSIS — Z7982 Long term (current) use of aspirin: Secondary | ICD-10-CM | POA: Diagnosis not present

## 2015-11-25 DIAGNOSIS — Z90710 Acquired absence of both cervix and uterus: Secondary | ICD-10-CM | POA: Diagnosis not present

## 2015-11-25 DIAGNOSIS — Z79899 Other long term (current) drug therapy: Secondary | ICD-10-CM | POA: Diagnosis not present

## 2015-11-25 DIAGNOSIS — L03115 Cellulitis of right lower limb: Secondary | ICD-10-CM | POA: Diagnosis present

## 2015-11-25 DIAGNOSIS — T814XXA Infection following a procedure, initial encounter: Secondary | ICD-10-CM | POA: Diagnosis present

## 2015-12-03 DIAGNOSIS — Z96641 Presence of right artificial hip joint: Secondary | ICD-10-CM | POA: Diagnosis not present

## 2015-12-05 DIAGNOSIS — Z96641 Presence of right artificial hip joint: Secondary | ICD-10-CM | POA: Diagnosis not present

## 2015-12-06 DIAGNOSIS — G8918 Other acute postprocedural pain: Secondary | ICD-10-CM | POA: Diagnosis not present

## 2015-12-10 DIAGNOSIS — Z96641 Presence of right artificial hip joint: Secondary | ICD-10-CM | POA: Diagnosis not present

## 2015-12-12 DIAGNOSIS — Z96641 Presence of right artificial hip joint: Secondary | ICD-10-CM | POA: Diagnosis not present

## 2015-12-17 DIAGNOSIS — Z96641 Presence of right artificial hip joint: Secondary | ICD-10-CM | POA: Diagnosis not present

## 2015-12-18 DIAGNOSIS — Z96641 Presence of right artificial hip joint: Secondary | ICD-10-CM | POA: Diagnosis not present

## 2015-12-18 DIAGNOSIS — M1611 Unilateral primary osteoarthritis, right hip: Secondary | ICD-10-CM | POA: Diagnosis not present

## 2015-12-24 DIAGNOSIS — Z96641 Presence of right artificial hip joint: Secondary | ICD-10-CM | POA: Diagnosis not present

## 2015-12-26 DIAGNOSIS — Z96641 Presence of right artificial hip joint: Secondary | ICD-10-CM | POA: Diagnosis not present

## 2015-12-29 DIAGNOSIS — R52 Pain, unspecified: Secondary | ICD-10-CM | POA: Diagnosis not present

## 2015-12-29 DIAGNOSIS — W19XXXA Unspecified fall, initial encounter: Secondary | ICD-10-CM | POA: Diagnosis not present

## 2015-12-29 DIAGNOSIS — M25551 Pain in right hip: Secondary | ICD-10-CM | POA: Diagnosis not present

## 2015-12-29 DIAGNOSIS — Z743 Need for continuous supervision: Secondary | ICD-10-CM | POA: Diagnosis not present

## 2015-12-30 ENCOUNTER — Emergency Department: Payer: Medicare Other

## 2015-12-30 ENCOUNTER — Emergency Department
Admission: EM | Admit: 2015-12-30 | Discharge: 2015-12-30 | Disposition: A | Discharge status: Other Acute Inpatient Hospital | Payer: Medicare Other | Attending: Emergency Medicine | Admitting: Emergency Medicine

## 2015-12-30 ENCOUNTER — Encounter: Payer: Self-pay | Admitting: Emergency Medicine

## 2015-12-30 DIAGNOSIS — Y939 Activity, unspecified: Secondary | ICD-10-CM | POA: Insufficient documentation

## 2015-12-30 DIAGNOSIS — Z743 Need for continuous supervision: Secondary | ICD-10-CM | POA: Diagnosis not present

## 2015-12-30 DIAGNOSIS — F909 Attention-deficit hyperactivity disorder, unspecified type: Secondary | ICD-10-CM | POA: Diagnosis not present

## 2015-12-30 DIAGNOSIS — S79919A Unspecified injury of unspecified hip, initial encounter: Secondary | ICD-10-CM | POA: Diagnosis not present

## 2015-12-30 DIAGNOSIS — Z96641 Presence of right artificial hip joint: Secondary | ICD-10-CM | POA: Diagnosis not present

## 2015-12-30 DIAGNOSIS — Y929 Unspecified place or not applicable: Secondary | ICD-10-CM | POA: Insufficient documentation

## 2015-12-30 DIAGNOSIS — S73004A Unspecified dislocation of right hip, initial encounter: Secondary | ICD-10-CM | POA: Diagnosis not present

## 2015-12-30 DIAGNOSIS — Z79899 Other long term (current) drug therapy: Secondary | ICD-10-CM | POA: Insufficient documentation

## 2015-12-30 DIAGNOSIS — I10 Essential (primary) hypertension: Secondary | ICD-10-CM | POA: Insufficient documentation

## 2015-12-30 DIAGNOSIS — Y999 Unspecified external cause status: Secondary | ICD-10-CM | POA: Insufficient documentation

## 2015-12-30 DIAGNOSIS — M24351 Pathological dislocation of right hip, not elsewhere classified: Secondary | ICD-10-CM | POA: Diagnosis not present

## 2015-12-30 DIAGNOSIS — M25551 Pain in right hip: Secondary | ICD-10-CM | POA: Diagnosis present

## 2015-12-30 DIAGNOSIS — X58XXXA Exposure to other specified factors, initial encounter: Secondary | ICD-10-CM | POA: Diagnosis not present

## 2015-12-30 DIAGNOSIS — Z96642 Presence of left artificial hip joint: Secondary | ICD-10-CM | POA: Insufficient documentation

## 2015-12-30 DIAGNOSIS — F418 Other specified anxiety disorders: Secondary | ICD-10-CM | POA: Insufficient documentation

## 2015-12-30 DIAGNOSIS — Z5181 Encounter for therapeutic drug level monitoring: Secondary | ICD-10-CM | POA: Diagnosis not present

## 2015-12-30 DIAGNOSIS — T84020A Dislocation of internal right hip prosthesis, initial encounter: Secondary | ICD-10-CM | POA: Diagnosis not present

## 2015-12-30 DIAGNOSIS — T07 Unspecified multiple injuries: Secondary | ICD-10-CM | POA: Diagnosis not present

## 2015-12-30 DIAGNOSIS — Z87891 Personal history of nicotine dependence: Secondary | ICD-10-CM | POA: Insufficient documentation

## 2015-12-30 LAB — BASIC METABOLIC PANEL
ANION GAP: 7 (ref 5–15)
BUN: 24 mg/dL — ABNORMAL HIGH (ref 6–20)
CHLORIDE: 109 mmol/L (ref 101–111)
CO2: 21 mmol/L — AB (ref 22–32)
Calcium: 8.8 mg/dL — ABNORMAL LOW (ref 8.9–10.3)
Creatinine, Ser: 1.07 mg/dL — ABNORMAL HIGH (ref 0.44–1.00)
GFR calc non Af Amer: 51 mL/min — ABNORMAL LOW (ref >60)
GFR, EST AFRICAN AMERICAN: 59 mL/min — AB (ref >60)
Glucose, Bld: 99 mg/dL (ref 65–99)
POTASSIUM: 4.4 mmol/L (ref 3.5–5.1)
SODIUM: 137 mmol/L (ref 135–145)

## 2015-12-30 LAB — CBC
HEMATOCRIT: 26.2 % — AB (ref 35.0–47.0)
HEMOGLOBIN: 8.2 g/dL — AB (ref 12.0–16.0)
MCH: 23.7 pg — ABNORMAL LOW (ref 26.0–34.0)
MCHC: 31.4 g/dL — ABNORMAL LOW (ref 32.0–36.0)
MCV: 75.6 fL — ABNORMAL LOW (ref 80.0–100.0)
Platelets: 551 10*3/uL — ABNORMAL HIGH (ref 150–440)
RBC: 3.47 MIL/uL — AB (ref 3.80–5.20)
RDW: 18.9 % — ABNORMAL HIGH (ref 11.5–14.5)
WBC: 9.7 10*3/uL (ref 3.6–11.0)

## 2015-12-30 LAB — SEDIMENTATION RATE: SED RATE: 67 mm/h — AB (ref 0–30)

## 2015-12-30 MED ORDER — ONDANSETRON HCL 4 MG/2ML IJ SOLN
4.0000 mg | Freq: Once | INTRAMUSCULAR | Status: AC
  Administered 2015-12-30: 4 mg via INTRAVENOUS
  Filled 2015-12-30: qty 2

## 2015-12-30 MED ORDER — HYDROMORPHONE HCL 1 MG/ML IJ SOLN
0.5000 mg | Freq: Once | INTRAMUSCULAR | Status: AC
  Administered 2015-12-30: 0.5 mg via INTRAVENOUS
  Filled 2015-12-30: qty 1

## 2015-12-30 MED ORDER — HYDROMORPHONE HCL 1 MG/ML IJ SOLN
1.0000 mg | Freq: Once | INTRAMUSCULAR | Status: AC
  Administered 2015-12-30: 1 mg via INTRAVENOUS
  Filled 2015-12-30: qty 1

## 2015-12-30 NOTE — ED Notes (Addendum)
Called FN to allow family/friends back.

## 2015-12-30 NOTE — ED Notes (Signed)
Patient states she was filling out an online job application while sitting on her couch and she felt a "pop".  Patient had a right hip replacement on Jan 27th 2017.  She says she has been active walking and attending church functions.  She is AOx4 and having right hip pain 10/10.

## 2015-12-30 NOTE — ED Notes (Signed)
Patient returned from XR. 

## 2015-12-30 NOTE — ED Notes (Signed)
Patient transported to X-ray 

## 2015-12-30 NOTE — ED Provider Notes (Signed)
[facility] Emergency Department Provider Note  ____________________________________________  Time seen: Approximately 1:12 AM  I have reviewed the triage vital signs and the nursing notes.   HISTORY  Chief Complaint Hip Pain    HPI Marie Dunn is a 70 y.o. female who comes into the hospital today with a hip dislocation. The patient reports that she was sitting filling out online job application and felt a pop in her right hip. The patient reports that she had a total hip replacement on January 24 done at [facility]. She reports that she did not fall or was not doing anything with her leg it just popped out on its own. The patient reports that she went to church this morning and had been doing fine. She walks regularly. She reports that when it occurred she had extreme pain at the time. She rates her pain a 10 out of 10 in intensity especially if she moves. The patient called ambulance to bring her into the hospital to get checked out and evaluated.   Past Medical History  Diagnosis Date  . ADHD (attention deficit hyperactivity disorder)   . Hypertension     Patient Active Problem List   Diagnosis Date Noted  . Adult attention deficit disorder 08/06/2015  . Mild depression 08/06/2015  . BP (high blood pressure) 08/06/2015  . Arthritis, degenerative 08/06/2015  . Encounter to establish care 08/06/2015  . Chronic LBP 10/15/2014  . Arthralgia of hip 11/08/2012    Past Surgical History  Procedure Laterality Date  . Abdominal hysterectomy    . Left hip replacement    . Left ankle surgery    . Total hip arthroplasty Right     Current Outpatient Rx  Name  Route  Sig  Dispense  Refill  . gabapentin (NEURONTIN) 100 MG capsule   Oral   Take 2 capsules by mouth 3 (three) times daily.      0   . hydrochlorothiazide (HYDRODIURIL) 25 MG tablet   Oral   Take 1 tablet (25 mg total) by mouth daily. for blood pressure   90 tablet   3   . lisinopril  (PRINIVIL,ZESTRIL) 20 MG tablet   Oral   Take 1 tablet (20 mg total) by mouth daily.   30 tablet   6   . pantoprazole (PROTONIX) 40 MG tablet   Oral   Take 1 tablet by mouth daily.      0   . traMADol (ULTRAM) 50 MG tablet   Oral   Take 50 mg by mouth every 6 (six) hours as needed. for pain      0     Allergies Ampicillin; Codeine; Morphine and related; Amoxicillin; and Prednisone  History reviewed. No pertinent family history.  Social History Social History  Substance Use Topics  . Smoking status: Former Research scientist (life sciences)  . Smokeless tobacco: Never Used  . Alcohol Use: No    Review of Systems Constitutional: No fever/chills Eyes: No visual changes. ENT: No sore throat. Cardiovascular: Denies chest pain. Respiratory: Denies shortness of breath. Gastrointestinal: No abdominal pain.  No nausea, no vomiting.  No diarrhea.  No constipation. Genitourinary: Negative for dysuria. Musculoskeletal: right hip pain Skin: Negative for rash. Neurological: Negative for headaches, focal weakness or numbness.  10-point ROS otherwise negative.  ____________________________________________   PHYSICAL EXAM:  VITAL SIGNS: ED Triage Vitals  Enc Vitals Group     BP 12/30/15 0108 153/92 mmHg     Pulse Rate 12/30/15 0108 97  Resp 12/30/15 0108 20     Temp 12/30/15 0108 97.8 F (36.6 C)     Temp src --      SpO2 12/30/15 0106 98 %     Weight 12/30/15 0108 140 lb (63.504 kg)     Height 12/30/15 0108 '5\' 3"'$  (1.6 m)     Head Cir --      Peak Flow --      Pain Score 12/30/15 0111 10     Pain Loc --      Pain Edu? --      Excl. in [place]? --     Constitutional: Alert and oriented. Well appearing and in no acute distress. Eyes: Conjunctivae are normal. PERRL. EOMI. Head: Atraumatic. Nose: No congestion/rhinnorhea. Mouth/Throat: Mucous membranes are moist.  Oropharynx non-erythematous. Cardiovascular: Normal rate, regular rhythm. Grossly normal heart sounds.  Good peripheral  circulation. Respiratory: Normal respiratory effort.  No retractions. Lungs CTAB. Gastrointestinal: Soft and nontender. No distention.positive bowel sounds Musculoskeletal: right hip tenderness to palpation. Right leg shortened and internally rotated.   Neurologic:  Normal speech and language. Skin:  Erythema to right lateral hip Psychiatric: Mood and affect are normal.   ____________________________________________   LABS (all labs ordered are listed, but only abnormal results are displayed)  Labs Reviewed  CBC - Abnormal; Notable for the following:    RBC 3.47 (*)    Hemoglobin 8.2 (*)    HCT 26.2 (*)    MCV 75.6 (*)    MCH 23.7 (*)    MCHC 31.4 (*)    RDW 18.9 (*)    Platelets 551 (*)    All other components within normal limits  BASIC METABOLIC PANEL - Abnormal; Notable for the following:    CO2 21 (*)    BUN 24 (*)    Creatinine, Ser 1.07 (*)    Calcium 8.8 (*)    GFR calc non Af Amer 51 (*)    GFR calc Af Amer 59 (*)    All other components within normal limits  SEDIMENTATION RATE - Abnormal; Notable for the following:    Sed Rate 67 (*)    All other components within normal limits   ____________________________________________  EKG  none ____________________________________________  RADIOLOGY  Right hip x-ray: Right total hip arthroplasty dislocation. ____________________________________________   PROCEDURES  Procedure(s) performed: None  Critical Care performed: No  ____________________________________________   INITIAL IMPRESSION / ASSESSMENT AND PLAN / ED COURSE  Pertinent labs & imaging results that were available during my care of the patient were reviewed by me and considered in my medical decision making (see chart for details).  This is 70 year old female who comes into the hospital today with some right-sided hip pain and feeling a pop with a concern for dislocation. The patient receive a dose of Dilaudid 0.5 mg IV 1 and we will check  some blood work and an x-ray. I will then contact orthopedic surgery for reduction of the patient's right hip dislocation.  I initially contacted Dr. Roland Rack given the patient's prosthetic hip and the dislocation. Dr. Roland Rack evaluated the x-ray and he was concerned that the patient may need to be taken to the operating room. The patient had her surgery approximately 2 months ago and has had a complication of a postop infection. The patient also does have some right hip erythema which again is concerning for possible infection. It was recommended that I transfer the patient to [facility] where her surgery was previously performed. I discussed this with the  patient and she and her family agreed that having surgery would be preferred at [facility] versus here. I will give the patient a milligram of Dilaudid. I contacted the transfer center and the patient was accepted by Dr. Guss Bunde to [facility] emergency Department. ____________________________________________   FINAL CLINICAL IMPRESSION(S) / ED DIAGNOSES  Final diagnoses:  Hip dislocation, right, initial encounter ([place])      Loney Hering, MD 12/30/15 [phone]

## 2016-02-17 DIAGNOSIS — Z96641 Presence of right artificial hip joint: Secondary | ICD-10-CM | POA: Diagnosis not present

## 2016-02-19 DIAGNOSIS — Z96641 Presence of right artificial hip joint: Secondary | ICD-10-CM | POA: Diagnosis not present

## 2016-02-21 DIAGNOSIS — Z96641 Presence of right artificial hip joint: Secondary | ICD-10-CM | POA: Diagnosis not present

## 2016-02-24 DIAGNOSIS — Z96641 Presence of right artificial hip joint: Secondary | ICD-10-CM | POA: Diagnosis not present

## 2016-02-26 DIAGNOSIS — Z96641 Presence of right artificial hip joint: Secondary | ICD-10-CM | POA: Diagnosis not present

## 2016-02-28 DIAGNOSIS — Z96641 Presence of right artificial hip joint: Secondary | ICD-10-CM | POA: Diagnosis not present

## 2016-03-03 DIAGNOSIS — Z96641 Presence of right artificial hip joint: Secondary | ICD-10-CM | POA: Diagnosis not present

## 2016-03-05 DIAGNOSIS — Z96641 Presence of right artificial hip joint: Secondary | ICD-10-CM | POA: Diagnosis not present

## 2016-03-06 ENCOUNTER — Emergency Department
Admission: EM | Admit: 2016-03-06 | Discharge: 2016-03-07 | Disposition: A | Discharge status: Home / Self Care | Payer: Medicare Other | Attending: Emergency Medicine | Admitting: Emergency Medicine

## 2016-03-06 ENCOUNTER — Emergency Department: Payer: Medicare Other

## 2016-03-06 ENCOUNTER — Encounter: Payer: Self-pay | Admitting: Urgent Care

## 2016-03-06 DIAGNOSIS — X501XXA Overexertion from prolonged static or awkward postures, initial encounter: Secondary | ICD-10-CM | POA: Diagnosis not present

## 2016-03-06 DIAGNOSIS — Y939 Activity, unspecified: Secondary | ICD-10-CM | POA: Insufficient documentation

## 2016-03-06 DIAGNOSIS — M25551 Pain in right hip: Secondary | ICD-10-CM

## 2016-03-06 DIAGNOSIS — Y999 Unspecified external cause status: Secondary | ICD-10-CM | POA: Insufficient documentation

## 2016-03-06 DIAGNOSIS — I10 Essential (primary) hypertension: Secondary | ICD-10-CM | POA: Diagnosis not present

## 2016-03-06 DIAGNOSIS — S79911A Unspecified injury of right hip, initial encounter: Secondary | ICD-10-CM | POA: Diagnosis present

## 2016-03-06 DIAGNOSIS — M1990 Unspecified osteoarthritis, unspecified site: Secondary | ICD-10-CM | POA: Diagnosis not present

## 2016-03-06 DIAGNOSIS — F329 Major depressive disorder, single episode, unspecified: Secondary | ICD-10-CM | POA: Diagnosis not present

## 2016-03-06 DIAGNOSIS — Z96642 Presence of left artificial hip joint: Secondary | ICD-10-CM | POA: Diagnosis not present

## 2016-03-06 DIAGNOSIS — Y929 Unspecified place or not applicable: Secondary | ICD-10-CM | POA: Insufficient documentation

## 2016-03-06 DIAGNOSIS — Z87891 Personal history of nicotine dependence: Secondary | ICD-10-CM | POA: Diagnosis not present

## 2016-03-06 DIAGNOSIS — F909 Attention-deficit hyperactivity disorder, unspecified type: Secondary | ICD-10-CM | POA: Insufficient documentation

## 2016-03-06 DIAGNOSIS — Z79899 Other long term (current) drug therapy: Secondary | ICD-10-CM | POA: Diagnosis not present

## 2016-03-06 DIAGNOSIS — Y828 Other medical devices associated with adverse incidents: Secondary | ICD-10-CM | POA: Insufficient documentation

## 2016-03-06 DIAGNOSIS — S73006A Unspecified dislocation of unspecified hip, initial encounter: Secondary | ICD-10-CM

## 2016-03-06 DIAGNOSIS — T84020A Dislocation of internal right hip prosthesis, initial encounter: Secondary | ICD-10-CM | POA: Diagnosis not present

## 2016-03-06 DIAGNOSIS — S73004A Unspecified dislocation of right hip, initial encounter: Secondary | ICD-10-CM | POA: Diagnosis not present

## 2016-03-06 DIAGNOSIS — Z96641 Presence of right artificial hip joint: Secondary | ICD-10-CM | POA: Diagnosis not present

## 2016-03-06 DIAGNOSIS — T84021A Dislocation of internal left hip prosthesis, initial encounter: Secondary | ICD-10-CM | POA: Diagnosis not present

## 2016-03-06 DIAGNOSIS — W19XXXA Unspecified fall, initial encounter: Secondary | ICD-10-CM | POA: Diagnosis not present

## 2016-03-06 DIAGNOSIS — Z9889 Other specified postprocedural states: Secondary | ICD-10-CM

## 2016-03-06 MED ORDER — HYDROMORPHONE HCL 1 MG/ML IJ SOLN
1.0000 mg | Freq: Once | INTRAMUSCULAR | Status: AC
  Administered 2016-03-07: 1 mg via INTRAVENOUS
  Filled 2016-03-06: qty 1

## 2016-03-06 MED ORDER — HYDROMORPHONE HCL 1 MG/ML IJ SOLN
1.0000 mg | Freq: Once | INTRAMUSCULAR | Status: AC
  Administered 2016-03-06: 1 mg via INTRAVENOUS
  Filled 2016-03-06: qty 1

## 2016-03-06 NOTE — ED Notes (Signed)
Pt requesting pain medication, MD notified.

## 2016-03-06 NOTE — ED Provider Notes (Signed)
[facility] Emergency Department Proider Note  ____________________________________________  Time seen: Approximately 11:33 PM  I have reviewed the triage vital signs and the nursing notes.   HISTORY  Chief Complaint Hip Pain    HPI Marie Dunn is a 70 y.o. female presents for evaluation of right hip pain.  Patient was sitting on the couch, she reports that she moved and suddenly felt her right hip pop out. She's had this happen before. Reports severe pain in the right hip. There was no fall or injury. No numbness tingling or weakness in the leg but she can't move it because of the hip hurts too much.  She is had her hip replaced recently, she's had [place] once dislocated in the past.   Past Medical History  Diagnosis Date  . ADHD (attention deficit hyperactivity disorder)   . Hypertension     Patient Active Problem List   Diagnosis Date Noted  . Adult attention deficit disorder 08/06/2015  . Mild depression 08/06/2015  . BP (high blood pressure) 08/06/2015  . Arthritis, degenerative 08/06/2015  . Encounter to establish care 08/06/2015  . Chronic LBP 10/15/2014  . Arthralgia of hip 11/08/2012    Past Surgical History  Procedure Laterality Date  . Abdominal hysterectomy    . Left hip replacement    . Left ankle surgery    . Total hip arthroplasty Right     Current Outpatient Rx  Name  Route  Sig  Dispense  Refill  . gabapentin (NEURONTIN) 100 MG capsule   Oral   Take 2 capsules by mouth 3 (three) times daily.      0   . hydrochlorothiazide (HYDRODIURIL) 25 MG tablet   Oral   Take 1 tablet (25 mg total) by mouth daily. for blood pressure   90 tablet   3   . lisinopril (PRINIVIL,ZESTRIL) 20 MG tablet   Oral   Take 1 tablet (20 mg total) by mouth daily.   30 tablet   6   . pantoprazole (PROTONIX) 40 MG tablet   Oral   Take 1 tablet by mouth daily.      0   . traMADol (ULTRAM) 50 MG tablet   Oral   Take 50 mg by mouth  every 6 (six) hours as needed. for pain      0     Allergies Ampicillin; Codeine; Morphine and related; Amoxicillin; and Prednisone  No family history on file.  Social History Social History  Substance Use Topics  . Smoking status: Former Research scientist (life sciences)  . Smokeless tobacco: Never Used  . Alcohol Use: No    Review of Systems Constitutional: No fever/chills Eyes: No visual changes. ENT: No sore throat. Cardiovascular: Denies chest pain. Respiratory: Denies shortness of breath. Gastrointestinal: No abdominal pain.  No nausea, no vomiting.  No diarrhea.  No constipation. Genitourinary: Negative for dysuria. Musculoskeletal: Negative for back pain. Skin: Negative for rash. Neurological: Negative for headaches, focal weakness or numbness.  10-point ROS otherwise negative.  ____________________________________________   PHYSICAL EXAM:  VITAL SIGNS: ED Triage Vitals  Enc Vitals Group     BP 03/06/16 2219 163/96 mmHg     Pulse Rate 03/06/16 2219 97     Resp 03/06/16 2219 18     Temp 03/06/16 2219 97.9 F (36.6 C)     Temp Source 03/06/16 2219 Oral     SpO2 03/06/16 2219 100 %     Weight 03/06/16 2219 150 lb (68.04 kg)     Height  03/06/16 2219 '5\' 3"'$  (1.6 m)     Head Cir --      Peak Flow --      Pain Score 03/06/16 2220 6     Pain Loc --      Pain Edu? --      Excl. in [place]? --    Constitutional: Alert and oriented. Appears in moderate to severe pain but otherwise no acute distress. Eyes: Conjunctivae are normal. PERRL. EOMI. Head: Atraumatic. Nose: No congestion/rhinnorhea. Mouth/Throat: Mucous membranes are moist.  Oropharynx non-erythematous. Neck: No stridor.   Cardiovascular: Normal rate, regular rhythm. Grossly normal heart sounds.  Good peripheral circulation. Respiratory: Normal respiratory effort.  No retractions. Lungs CTAB. Gastrointestinal: Soft and nontender. No distention. No abdominal bruits. No CVA tenderness. Musculoskeletal:   RIGHT Right upper  extremity demonstrates normal strength, good use of all muscles. No edema bruising or contusions of the right shoulder/upper arm, right elbow, right forearm / hand. Full range of motion of the right right upper extremity without pain. No evidence of trauma. Strong radial pulse. Intact median/ulnar/radial neuro-muscular exam.  LEFT Left upper extremity demonstrates normal strength, good use of all muscles. No edema bruising or contusions of the left shoulder/upper arm, left elbow, left forearm / hand. Full range of motion of the left  upper extremity without pain. No evidence of trauma. Strong radial pulse. Intact median/ulnar/radial neuro-muscular exam.  Lower Extremities  No edema. Normal DP/PT pulses bilateral with good cap refill.  Normal neuro-motor function lower extremities bilateral.  RIGHT Right lower extremity demonstrates limited range of motion at the right hip. No evidence of trauma or tenderness over the ankle, foot, lower leg, knee. No joint effusions. There is a slight defect overlying the right hip joint. Strong and normal dorsalis pedis and posterior tibial pulses. No evidence of trauma.  LEFT Left lower extremity demonstrates normal strength, good use of all muscles. No edema bruising or contusions of the hip,  knee, ankle. Full range of motion of the left lower extremity without pain. No pain on axial loading. No evidence of trauma.   Neurologic:  Normal speech and language. No gross focal neurologic deficits are appreciated. No gait instability. Skin:  Skin is warm, dry and intact. No rash noted. Psychiatric: Mood and affect are normal. Speech and behavior are normal.  ____________________________________________   LABS (all labs ordered are listed, but only abnormal results are displayed)  Labs Reviewed - No data to display ____________________________________________  EKG   ____________________________________________  RADIOLOGY  DG Pelvis Portable (Final  result) Result time: 03/07/16 03:00:10   Final result by Rad Results In Interface (03/07/16 03:00:10)   Narrative:   CLINICAL DATA: Postreduction.  EXAM: PORTABLE PELVIS 1-2 VIEWS  COMPARISON: Pre reduction exam is earlier this day.  FINDINGS: Femoral head component of the right hip arthroplasty is no seated in the acetabular component. Femoral stem not included in the field of view. No periprosthetic fracture is seen. Left hip arthroplasty is intact.  IMPRESSION: Successful reduction of right hip arthroplasty dislocation.   Electronically Signed By: Jeb Levering M.D. On: 03/07/2016 03:00          DG HIP UNILAT WITH PELVIS 1V RIGHT (Final result) Result time: 03/07/16 01:23:33   Procedure changed from [place] RIGHT      Final result by Rad Results In Interface (03/07/16 01:23:33)   Narrative:   CLINICAL DATA: Status post attempted reduction of right hip prosthesis. Initial encounter.  EXAM: DG HIP (WITH  OR WITHOUT PELVIS) 1V RIGHT  COMPARISON: Right hip radiographs performed 03/06/2016  FINDINGS: There is persistent superior dislocation of the right hip prosthesis. There is no evidence of fracture or loosening.  Mild degenerative change is noted along the lower lumbar spine. The right sacroiliac joint is grossly unremarkable. No definite soft tissue abnormalities are characterized on radiograph.  IMPRESSION: Persistent superior dislocation of the patient's right hip prosthesis. No evidence of fracture or loosening.   Electronically Signed By: Garald Balding M.D. On: 03/07/2016 01:23          DG HIP UNILAT WITH PELVIS 2-3 VIEWS RIGHT (Final result) Result time: 03/06/16 23:04:07   Procedure changed from [place] 2-3 VIEWS RIGHT      Final result by Rad Results In Interface (03/06/16 23:04:07)   Narrative:   CLINICAL DATA: Twisted hip and felt hip dislocate  today  EXAM: DG HIP (WITH OR WITHOUT PELVIS) 2-3V RIGHT  COMPARISON: 12/30/2015  FINDINGS: Bilateral hip arthroplasty. Recurrent or persistent superior lateral dislocation of the right arthroplasty. No complicating fracture. Degenerative disc disease is eccentric right at L4-5.  IMPRESSION: Right hip arthroplasty dislocation.   Electronically Signed By: Abigail Miyamoto M.D. On: 03/06/2016 23:04       ____________________________________________   PROCEDURES  Procedure(s) performed: Right hip reduction, conscious sedation, see procedure note(s).  Reduction of dislocation Date/Time: 12:24 AM Performed by: Delman Kitten Authorized by: Delman Kitten Consent: Verbal consent obtained. Risks and benefits: risks, benefits and alternatives were discussed Consent given by: patient Time out: Immediately prior to procedure a "time out" was called to verify the correct patient, procedure, equipment, support staff and site/side marked as required.  Patient pain control with: 2.5 mg hydromorphone (the patient did not wish to have moderate sedation performed, states she had that in the past and wish for only pain control even after discussing clinical benefits of moderate sedation to improve chances of reduction and also reduce her pain)  Vitals: Vital signs were monitored during sedation. Patient tolerance: Patient tolerated the procedure well with no immediate complications. Joint: Right hip Reduction technique: Capt. Lilia Pro   Post reducation attempt: Normal dorsalis pedis and posterior tibial pulse on the right, good toe wiggle and no evidence of neurologic deficit.  X-ray demonstrates  that it is still out of alignment.     Critical Care performed: No  ----------------------------------------- 2:23 AM on 03/07/2016 -----------------------------------------  Orthopedics at bedside. Myself and orthopedic doctor reviewed planned procedure, I discussed carefully the risks  benefits and alternatives of her biting sedation for hip reduction. Patient signs consent, the daughter agreeable as well. Discussed specific risks including that she could stop breathing, she could end up on a ventilator, she could require bag valve masking, she could aspirate. Pneumonia, she could have a seizure, her heart could stop, and that she could possibly die due to complications of sedation however these risks are unlikely area given the patient's need for reduction of the right hip and inability to reduce it after adequate pain control previous, we will proceed with sedation. Dr. Roland Rack will be performing the hip reduction.  The patient is awake alert, in no distress with clear respiratory effort clear lungs, normal heart tones prior to the procedure. ____________________________________________   INITIAL IMPRESSION / ASSESSMENT AND PLAN / ED COURSE  Pertinent labs & imaging results that were available during my care of the patient were reviewed by me and considered in my medical decision making (see chart for details).  Right hip dislocation.  No evidence of other injury. Successful reduction after sedation and orthopedic consultation. Patient has a right hip brace, will wear and follow-up closely with her doctor.  Return precautions given. Patient awake alert and well oriented at time of discharge.  Return precautions and treatment recommendations and follow-up discussed with the patient who is agreeable with the plan.  ____________________________________________   FINAL CLINICAL IMPRESSION(S) / ED DIAGNOSES  Final diagnoses:  Dislocation closed, hip (HCC)      Delman Kitten, MD 03/07/16 [phone]

## 2016-03-06 NOTE — ED Notes (Addendum)
Patient presents to ED 6 via EMS from home. Patient with (+) RIGHT hip pain. PMH significant for hip dislocations x 2 s/p hip replacement in January. (+) PMS noted distally; foot cool as compared contralaterally. EMS placed line and gave patient 165mg of Fentanyl. Patient presents in pain with self reported rating of 6/10 at rest -Edd Fabian MD aware.

## 2016-03-06 NOTE — ED Notes (Signed)
Pt transported to xray via stretcher

## 2016-03-06 NOTE — ED Notes (Addendum)
Dr. Jacqualine Code in room attempting to place hip back in place, unsuccessful attempt.

## 2016-03-07 ENCOUNTER — Emergency Department: Payer: Medicare Other

## 2016-03-07 DIAGNOSIS — T84020A Dislocation of internal right hip prosthesis, initial encounter: Secondary | ICD-10-CM | POA: Diagnosis not present

## 2016-03-07 MED ORDER — HYDROMORPHONE HCL 1 MG/ML IJ SOLN
INTRAMUSCULAR | Status: AC
  Administered 2016-03-07: 0.5 mg via INTRAVENOUS
  Filled 2016-03-07: qty 1

## 2016-03-07 MED ORDER — ONDANSETRON HCL 4 MG/2ML IJ SOLN
4.0000 mg | Freq: Once | INTRAMUSCULAR | Status: AC
  Administered 2016-03-07: 4 mg via INTRAVENOUS
  Filled 2016-03-07: qty 2

## 2016-03-07 MED ORDER — HYDROMORPHONE HCL 1 MG/ML IJ SOLN
0.5000 mg | Freq: Once | INTRAMUSCULAR | Status: AC
  Administered 2016-03-07: 0.5 mg via INTRAVENOUS

## 2016-03-07 MED ORDER — ETOMIDATE 2 MG/ML IV SOLN
0.0900 mg/kg | Freq: Once | INTRAVENOUS | Status: DC
  Filled 2016-03-07: qty 10

## 2016-03-07 MED ORDER — NALOXONE HCL 2 MG/2ML IJ SOSY
PREFILLED_SYRINGE | INTRAMUSCULAR | Status: AC
  Filled 2016-03-07: qty 2

## 2016-03-07 MED ORDER — ETOMIDATE 2 MG/ML IV SOLN
6.8000 mg | Freq: Once | INTRAVENOUS | Status: AC
  Administered 2016-03-07: 6.8 mg via INTRAVENOUS

## 2016-03-07 NOTE — Consult Note (Signed)
ORTHOPAEDIC CONSULTATION  REQUESTING PHYSICIAN: No att. providers found  Chief Complaint:   Right hip pain.  History of Present Illness: Marie Dunn is a 70 y.o. female with a history of hypertension and ADHD who lives independently. Apparently, she underwent a right total hip arthroplasty by Dr. Lowella Petties at [facility] 4 months ago. Her postoperative course was notable for a prosthetic hip dislocation approximately 2 months later. The hip was reduced and she was placed into a hip abduction brace. The patient states that normally she had been wearing this brace faithfully, but admits that she was not wearing it today. Apparently she pivoted on her right leg late this afternoon and felt her hip pop out. She contacted her neighbor who called EMS. She was brought to the emergency room where x-rays confirmed a prosthetic hip dislocation. The ER physician tried to reduce the hip several times under IV narcotic medication sedation without success, prompting an orthopedic consultation.  Past Medical History  Diagnosis Date  . ADHD (attention deficit hyperactivity disorder)   . Hypertension    Past Surgical History  Procedure Laterality Date  . Abdominal hysterectomy    . Left hip replacement    . Left ankle surgery    . Total hip arthroplasty Right    Social History   Social History  . Marital Status: Divorced    Spouse Name: N/A  . Number of Children: N/A  . Years of Education: N/A   Social History Main Topics  . Smoking status: Former Research scientist (life sciences)  . Smokeless tobacco: Never Used  . Alcohol Use: No  . Drug Use: No  . Sexual Activity: No   Other Topics Concern  . None   Social History Narrative   No family history on file. Allergies  Allergen Reactions  . Ampicillin Anaphylaxis  . Codeine Other (See Comments)  . Morphine And Related Rash    Other Reaction: Intolerance  . Amoxicillin Swelling   . Prednisone Swelling   Prior to Admission medications   Medication Sig Start Date End Date Taking? Authorizing Provider  gabapentin (NEURONTIN) 100 MG capsule Take 2 capsules by mouth 3 (three) times daily. 11/13/15   Historical Provider, MD  hydrochlorothiazide (HYDRODIURIL) 25 MG tablet Take 1 tablet (25 mg total) by mouth daily. for blood pressure 08/06/15   Arlis Porta., MD  lisinopril (PRINIVIL,ZESTRIL) 20 MG tablet Take 1 tablet (20 mg total) by mouth daily. 08/06/15   Arlis Porta., MD  pantoprazole (PROTONIX) 40 MG tablet Take 1 tablet by mouth daily. 11/13/15   Historical Provider, MD  traMADol (ULTRAM) 50 MG tablet Take 50 mg by mouth every 6 (six) hours as needed. for pain 11/14/15   Historical Provider, MD   Dg Hip Unilat With Pelvis 1v Right  03/07/2016  CLINICAL DATA:  Status post attempted reduction of right hip prosthesis. Initial encounter. EXAM: DG HIP (WITH OR WITHOUT PELVIS) 1V RIGHT COMPARISON:  Right hip radiographs performed 03/06/2016 FINDINGS: There is persistent superior dislocation of the right hip prosthesis. There is no evidence of fracture or loosening. Mild degenerative change is noted along the lower lumbar spine. The right sacroiliac joint is grossly unremarkable. No definite soft tissue abnormalities are characterized on radiograph. IMPRESSION: Persistent superior dislocation of the patient's right hip prosthesis. No evidence of fracture or loosening. Electronically Signed   By: Garald Balding M.D.   On: 03/07/2016 01:23   Dg Hip Unilat With Pelvis 2-3 Views Right  03/06/2016  CLINICAL DATA:  Twisted hip and felt hip dislocate today EXAM: DG HIP (WITH OR WITHOUT PELVIS) 2-3V RIGHT COMPARISON:  12/30/2015 FINDINGS: Bilateral hip arthroplasty. Recurrent or persistent superior lateral dislocation of the right arthroplasty. No complicating fracture. Degenerative disc disease is eccentric right at L4-5. IMPRESSION: Right hip arthroplasty dislocation.  Electronically Signed   By: Abigail Miyamoto M.D.   On: 03/06/2016 23:04    Positive ROS: All other systems have been reviewed and were otherwise negative with the exception of those mentioned in the HPI and as above.  Physical Exam: General:  Alert, no acute distress Psychiatric:  Patient is competent for consent with normal mood and affect   Cardiovascular:  No pedal edema Respiratory:  No wheezing, non-labored breathing GI:  Abdomen is soft and non-tender Skin:  No lesions in the area of chief complaint Neurologic:  Sensation intact distally Lymphatic:  No axillary or cervical lymphadenopathy  Orthopedic Exam:  Orthopedic examination is limited to the right hip and lower extremity. There is a well-healed surgical incision over the lateral aspect of the right hip without evidence for infection. The right lower extremity is obviously shortened and internally rotated as compared to the left. She has significant pain with any attempted active or passive motion of the right hip or lower extremity. She is neurovascularly intact to the right lower extremity and foot.  X-rays:  X-rays of the pelvis and right hip are available for review. The findings are as described above. There is a posterior superior dislocation of the prosthesis. The components appear to be well fixed. No fractures or other abnormalities are identified.  Assessment: Recurrent right prosthetic hip dislocation.  Plan: The treatment options were discussed with the patient. After obtaining formal written consent, the hip was reduced under IV sedation using traction and countertraction with the patient on her left side. The patient was placed back into her hip abduction brace before the adequacy of reduction was verified by an AP pelvis x-ray.  The patient was advised to maintain remain in her brace at all times, removing it only for bathing purposes, and to keep her follow-up appointment with Dr. Lowella Petties on Wednesday.   Pascal Lux, MD  Beeper #:  [phone]  03/07/2016 2:45 AM

## 2016-03-07 NOTE — ED Notes (Signed)

## 2016-03-07 NOTE — Discharge Instructions (Signed)
Follow-up with [facility] at your appointment this week. Wear your hip brace at all times.  Hip Dislocation Hip dislocation is the displacement of the "ball" at the head of your thigh bone (femur) from its socket in the hip bone (pelvis). The ball-and-socket structure of the hip joint gives it a lot of stability, while allowing it to move freely. Therefore, a lot of force is required to displace the femur from its socket. A hip dislocation is an emergency. If you believe you have dislocated your hip and cannot move your leg, call for help immediately. Do not try to move. CAUSES The most common cause of hip dislocation is motor vehicle accidents. However, force from falls from a height (a ladder or building), injuries from contact sports, or injuries from industrial accidents can be enough to dislocate your hip. SYMPTOMS A hip dislocation is very painful. If you have a dislocated hip, you will not be able to move your hip. If you have nerve damage, you may not have feeling in your lower leg, foot, or ankle.  DIAGNOSIS Usually, your caregiver can diagnose a hip dislocation by looking at the position of your leg. Generally, X-ray exams are done to check for fractures in your femur or pelvis. The leg of the dislocated hip will appear shorter than the other leg, and your foot will be turned inward. TREATMENT  Your caregiver can manipulate your bones back into the joint (reduction). If there are no other complications involved with your dislocation, such as fractures or damage to blood vessels or nerves, this procedure can be done without surgery. Before this procedure, you will be given medicine so that you will not feel pain (anesthetic). Often specialized imaging exams are done after the reduction (magnetic resonance imaging [MRI] or computed tomography [CT]) to check for loose pieces of cartilage or bone in the joint. If a manual reduction fails or you have nerve damage, damage to your blood vessels,  or bone fractures, surgery will be necessary to perform the reduction.  HOME CARE INSTRUCTIONS The following measures can help to reduce pain and speed up the healing process:  Rest your injured joint. Do not move your joint if it is painful. Also, avoid activities similar to the one that caused your injury.  Apply ice to your injured joint for 1 to 2 days after your reduction or as directed by your caregiver. Applying ice helps to reduce inflammation and pain.  Put ice in a plastic bag.  Place a towel between your skin and the bag.  Leave the ice on for 15 to 20 minutes at a time, every 2 hours while you are awake.  Use crutches or a walker as directed by your caregiver.  Exercise your hip and leg as directed by your caregiver.  Take over-the-counter or prescription medicine for pain as directed by your caregiver. SEEK IMMEDIATE MEDICAL CARE IF:  Your pain becomes worse rather than better.  You feel like your hip has become dislocated again. MAKE SURE YOU:  Understand these instructions.  Will watch your condition.  Will get help right away if you are not doing well or get worse.   This information is not intended to replace advice given to you by your health care provider. Make sure you discuss any questions you have with your health care provider.   Document Released: 06/23/2001 Document Revised: 10/19/2014 Document Reviewed: 04/30/2015 Elsevier Interactive Patient Education [employer].

## 2016-03-10 DIAGNOSIS — Z96641 Presence of right artificial hip joint: Secondary | ICD-10-CM | POA: Diagnosis not present

## 2016-03-13 DIAGNOSIS — Z96643 Presence of artificial hip joint, bilateral: Secondary | ICD-10-CM | POA: Diagnosis not present

## 2016-03-13 DIAGNOSIS — D509 Iron deficiency anemia, unspecified: Secondary | ICD-10-CM | POA: Diagnosis not present

## 2016-03-13 DIAGNOSIS — I10 Essential (primary) hypertension: Secondary | ICD-10-CM | POA: Diagnosis not present

## 2016-03-13 DIAGNOSIS — T84029D Dislocation of unspecified internal joint prosthesis, subsequent encounter: Secondary | ICD-10-CM | POA: Diagnosis not present

## 2016-03-13 DIAGNOSIS — J449 Chronic obstructive pulmonary disease, unspecified: Secondary | ICD-10-CM | POA: Diagnosis not present

## 2016-03-13 DIAGNOSIS — F909 Attention-deficit hyperactivity disorder, unspecified type: Secondary | ICD-10-CM | POA: Diagnosis not present

## 2016-03-23 DIAGNOSIS — Z96643 Presence of artificial hip joint, bilateral: Secondary | ICD-10-CM | POA: Diagnosis not present

## 2016-03-23 DIAGNOSIS — G8918 Other acute postprocedural pain: Secondary | ICD-10-CM | POA: Diagnosis not present

## 2016-03-23 DIAGNOSIS — F320 Major depressive disorder, single episode, mild: Secondary | ICD-10-CM | POA: Diagnosis not present

## 2016-03-23 DIAGNOSIS — Z96641 Presence of right artificial hip joint: Secondary | ICD-10-CM | POA: Diagnosis not present

## 2016-03-23 DIAGNOSIS — M24451 Recurrent dislocation, right hip: Secondary | ICD-10-CM | POA: Diagnosis not present

## 2016-03-23 DIAGNOSIS — T84020A Dislocation of internal right hip prosthesis, initial encounter: Secondary | ICD-10-CM | POA: Diagnosis not present

## 2016-03-23 DIAGNOSIS — Z471 Aftercare following joint replacement surgery: Secondary | ICD-10-CM | POA: Diagnosis not present

## 2016-03-26 DIAGNOSIS — D509 Iron deficiency anemia, unspecified: Secondary | ICD-10-CM | POA: Diagnosis not present

## 2016-03-26 DIAGNOSIS — Z96641 Presence of right artificial hip joint: Secondary | ICD-10-CM | POA: Diagnosis not present

## 2016-03-26 DIAGNOSIS — Z471 Aftercare following joint replacement surgery: Secondary | ICD-10-CM | POA: Diagnosis not present

## 2016-03-26 DIAGNOSIS — T84020S Dislocation of internal right hip prosthesis, sequela: Secondary | ICD-10-CM | POA: Diagnosis not present

## 2016-03-26 DIAGNOSIS — J449 Chronic obstructive pulmonary disease, unspecified: Secondary | ICD-10-CM | POA: Diagnosis not present

## 2016-03-26 DIAGNOSIS — J439 Emphysema, unspecified: Secondary | ICD-10-CM | POA: Diagnosis not present

## 2016-03-26 DIAGNOSIS — M25551 Pain in right hip: Secondary | ICD-10-CM | POA: Diagnosis not present

## 2016-03-26 DIAGNOSIS — M545 Low back pain: Secondary | ICD-10-CM | POA: Diagnosis not present

## 2016-03-26 DIAGNOSIS — F339 Major depressive disorder, recurrent, unspecified: Secondary | ICD-10-CM | POA: Diagnosis not present

## 2016-03-26 DIAGNOSIS — Z7401 Bed confinement status: Secondary | ICD-10-CM | POA: Diagnosis not present

## 2016-03-26 DIAGNOSIS — R262 Difficulty in walking, not elsewhere classified: Secondary | ICD-10-CM | POA: Diagnosis not present

## 2016-03-26 DIAGNOSIS — M1611 Unilateral primary osteoarthritis, right hip: Secondary | ICD-10-CM | POA: Diagnosis not present

## 2016-03-26 DIAGNOSIS — Z96649 Presence of unspecified artificial hip joint: Secondary | ICD-10-CM | POA: Diagnosis not present

## 2016-03-26 DIAGNOSIS — B182 Chronic viral hepatitis C: Secondary | ICD-10-CM | POA: Diagnosis not present

## 2016-03-26 DIAGNOSIS — M6281 Muscle weakness (generalized): Secondary | ICD-10-CM | POA: Diagnosis not present

## 2016-03-26 DIAGNOSIS — I10 Essential (primary) hypertension: Secondary | ICD-10-CM | POA: Diagnosis not present

## 2016-03-27 DIAGNOSIS — Z96649 Presence of unspecified artificial hip joint: Secondary | ICD-10-CM | POA: Diagnosis not present

## 2016-04-28 DIAGNOSIS — Z96641 Presence of right artificial hip joint: Secondary | ICD-10-CM | POA: Diagnosis not present

## 2016-04-30 DIAGNOSIS — Z96641 Presence of right artificial hip joint: Secondary | ICD-10-CM | POA: Diagnosis not present

## 2016-05-05 DIAGNOSIS — Z96641 Presence of right artificial hip joint: Secondary | ICD-10-CM | POA: Diagnosis not present

## 2016-05-07 DIAGNOSIS — Z96641 Presence of right artificial hip joint: Secondary | ICD-10-CM | POA: Diagnosis not present

## 2016-05-12 DIAGNOSIS — Z96641 Presence of right artificial hip joint: Secondary | ICD-10-CM | POA: Diagnosis not present

## 2016-05-13 DIAGNOSIS — M25551 Pain in right hip: Secondary | ICD-10-CM | POA: Diagnosis not present

## 2016-05-13 DIAGNOSIS — Z96643 Presence of artificial hip joint, bilateral: Secondary | ICD-10-CM | POA: Diagnosis not present

## 2016-05-14 DIAGNOSIS — Z96641 Presence of right artificial hip joint: Secondary | ICD-10-CM | POA: Diagnosis not present

## 2016-05-18 DIAGNOSIS — Z96641 Presence of right artificial hip joint: Secondary | ICD-10-CM | POA: Diagnosis not present

## 2016-05-21 DIAGNOSIS — Z96641 Presence of right artificial hip joint: Secondary | ICD-10-CM | POA: Diagnosis not present

## 2016-05-26 DIAGNOSIS — Z96641 Presence of right artificial hip joint: Secondary | ICD-10-CM | POA: Diagnosis not present

## 2016-05-29 DIAGNOSIS — Z96641 Presence of right artificial hip joint: Secondary | ICD-10-CM | POA: Diagnosis not present

## 2016-06-01 DIAGNOSIS — Z96641 Presence of right artificial hip joint: Secondary | ICD-10-CM | POA: Diagnosis not present

## 2016-06-04 DIAGNOSIS — Z96641 Presence of right artificial hip joint: Secondary | ICD-10-CM | POA: Diagnosis not present

## 2016-06-08 DIAGNOSIS — Z96641 Presence of right artificial hip joint: Secondary | ICD-10-CM | POA: Diagnosis not present

## 2016-06-11 DIAGNOSIS — Z96641 Presence of right artificial hip joint: Secondary | ICD-10-CM | POA: Diagnosis not present

## 2016-06-16 DIAGNOSIS — Z96641 Presence of right artificial hip joint: Secondary | ICD-10-CM | POA: Diagnosis not present

## 2016-06-18 DIAGNOSIS — Z96641 Presence of right artificial hip joint: Secondary | ICD-10-CM | POA: Diagnosis not present

## 2016-06-23 DIAGNOSIS — Z96641 Presence of right artificial hip joint: Secondary | ICD-10-CM | POA: Diagnosis not present

## 2016-06-25 DIAGNOSIS — Z96641 Presence of right artificial hip joint: Secondary | ICD-10-CM | POA: Diagnosis not present

## 2016-07-07 DIAGNOSIS — Z96641 Presence of right artificial hip joint: Secondary | ICD-10-CM | POA: Diagnosis not present

## 2016-07-09 DIAGNOSIS — Z96649 Presence of unspecified artificial hip joint: Secondary | ICD-10-CM | POA: Diagnosis not present

## 2016-07-10 IMAGING — CR DG CHEST 2V
2 series · 2 of 2 positions shown · non-contrast
Comparison: None.

CLINICAL DATA: Shortness of breath. Cough. Weakness for the past
1.5 weeks. Diagnosed with pneumonia 1 week ago. Bilateral pneumonia
6 days ago. Currently taking a second round of antibiotics.

EXAM:
CHEST  2 VIEW

[chest pa]
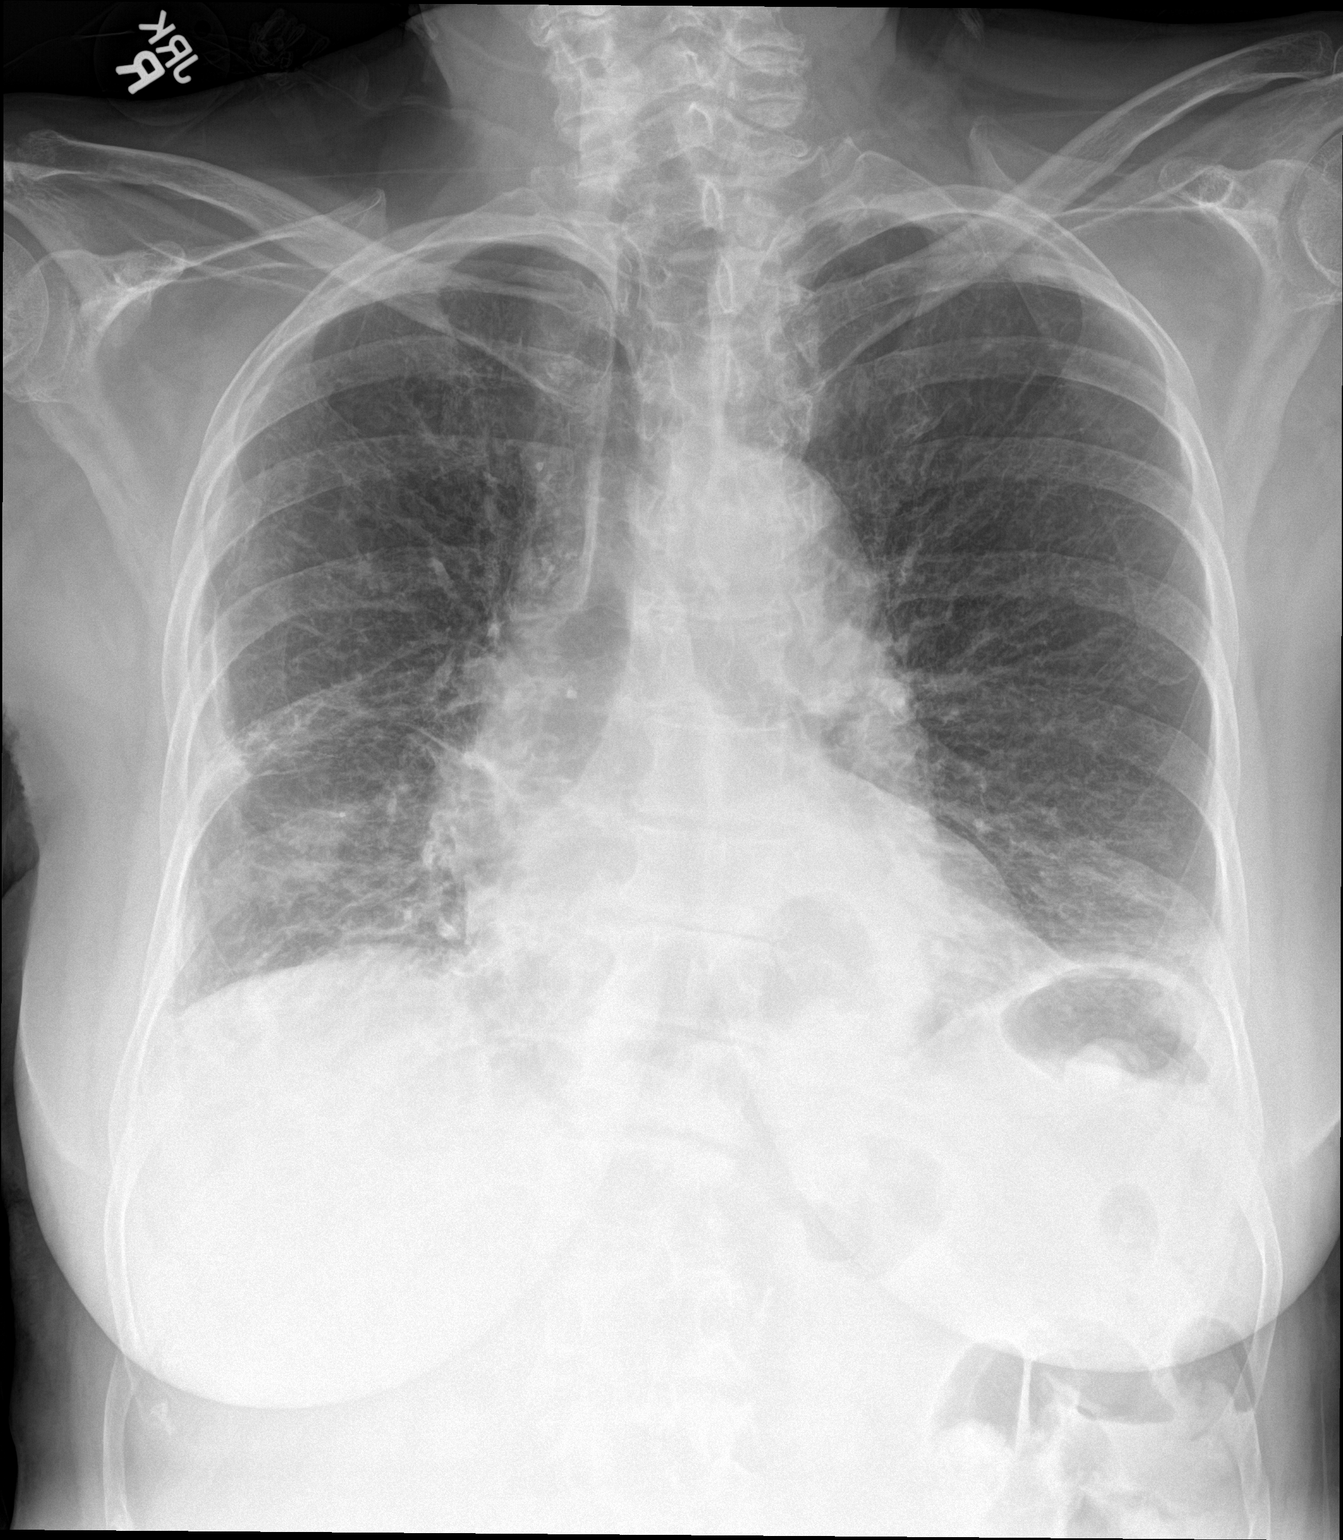

[chest lat]
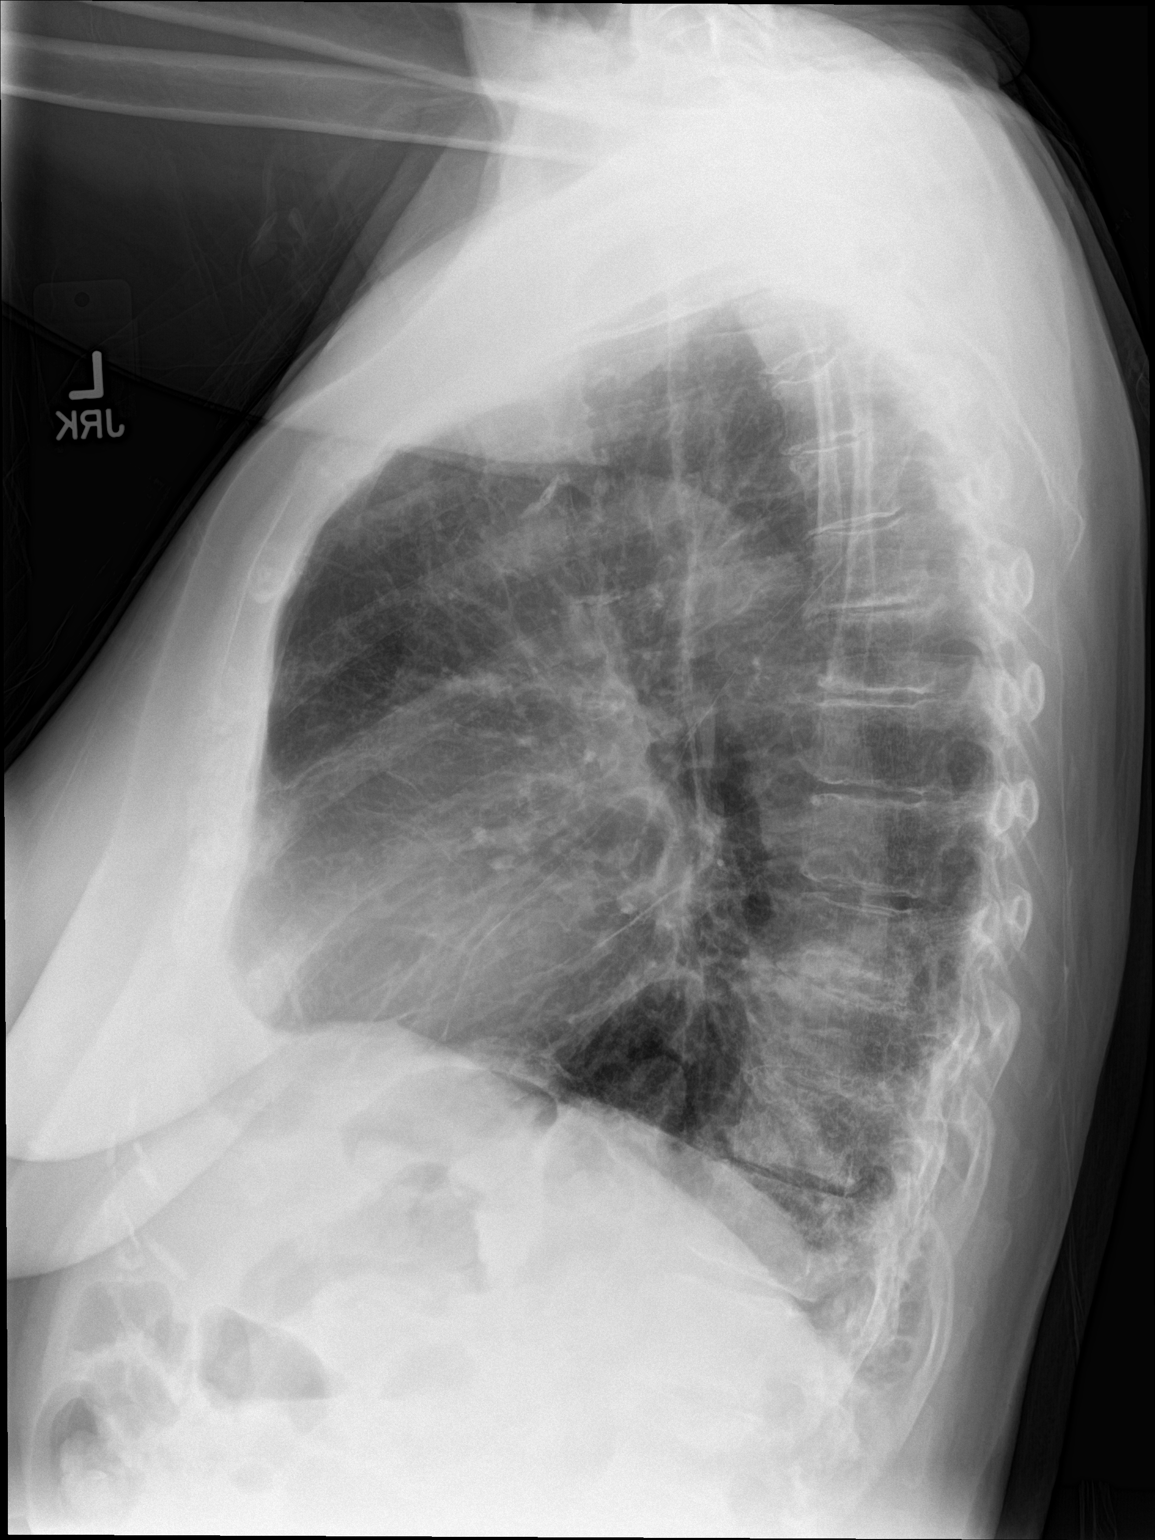

[2 of 2 positions shown; findings below may reference images not displayed]

FINDINGS: Borderline enlarged cardiac silhouette. Moderate-sized hiatal
hernia. Diffusely prominent pulmonary vasculature and interstitial
markings. Small amount of right lateral pleural and parenchymal
scarring. Minimal bibasilar atelectasis or scarring. Thoracic spine
degenerative changes and mild scoliosis.
IMPRESSION: 1. Minimal bibasilar atelectasis or scarring.
2. Borderline cardiomegaly and mild changes of COPD.
3. Moderate-sized hiatal hernia.

## 2016-07-14 DIAGNOSIS — Z96649 Presence of unspecified artificial hip joint: Secondary | ICD-10-CM | POA: Diagnosis not present

## 2016-07-22 DIAGNOSIS — Z96649 Presence of unspecified artificial hip joint: Secondary | ICD-10-CM | POA: Diagnosis not present

## 2016-07-28 DIAGNOSIS — Z96649 Presence of unspecified artificial hip joint: Secondary | ICD-10-CM | POA: Diagnosis not present

## 2016-08-04 DIAGNOSIS — Z96649 Presence of unspecified artificial hip joint: Secondary | ICD-10-CM | POA: Diagnosis not present

## 2016-08-11 DIAGNOSIS — Z96649 Presence of unspecified artificial hip joint: Secondary | ICD-10-CM | POA: Diagnosis not present

## 2016-08-20 DIAGNOSIS — Z96649 Presence of unspecified artificial hip joint: Secondary | ICD-10-CM | POA: Diagnosis not present

## 2016-09-30 DIAGNOSIS — I10 Essential (primary) hypertension: Secondary | ICD-10-CM | POA: Diagnosis not present

## 2016-11-03 DIAGNOSIS — I10 Essential (primary) hypertension: Secondary | ICD-10-CM | POA: Diagnosis not present

## 2016-11-03 DIAGNOSIS — Z96649 Presence of unspecified artificial hip joint: Secondary | ICD-10-CM | POA: Diagnosis not present

## 2016-11-03 DIAGNOSIS — F902 Attention-deficit hyperactivity disorder, combined type: Secondary | ICD-10-CM | POA: Diagnosis not present

## 2016-11-03 DIAGNOSIS — B1920 Unspecified viral hepatitis C without hepatic coma: Secondary | ICD-10-CM | POA: Diagnosis not present

## 2016-12-03 ENCOUNTER — Ambulatory Visit
Admission: RE | Admit: 2016-12-03 | Discharge: 2016-12-03 | Disposition: A | Discharge status: Home / Self Care | Payer: Medicare Other | Source: Ambulatory Visit | Attending: Cardiology | Admitting: Cardiology

## 2016-12-03 ENCOUNTER — Ambulatory Visit
Admission: RE | Admit: 2016-12-03 | Discharge: 2016-12-03 | Disposition: A | Discharge status: Home / Self Care | Payer: Medicare Other | Source: Ambulatory Visit | Attending: Internal Medicine | Admitting: Internal Medicine

## 2016-12-03 ENCOUNTER — Other Ambulatory Visit: Payer: Self-pay | Admitting: Cardiology

## 2016-12-03 DIAGNOSIS — R059 Cough, unspecified: Secondary | ICD-10-CM

## 2016-12-03 DIAGNOSIS — K449 Diaphragmatic hernia without obstruction or gangrene: Secondary | ICD-10-CM | POA: Diagnosis not present

## 2016-12-03 DIAGNOSIS — J180 Bronchopneumonia, unspecified organism: Secondary | ICD-10-CM | POA: Diagnosis not present

## 2016-12-03 DIAGNOSIS — R918 Other nonspecific abnormal finding of lung field: Secondary | ICD-10-CM | POA: Diagnosis not present

## 2016-12-03 DIAGNOSIS — F902 Attention-deficit hyperactivity disorder, combined type: Secondary | ICD-10-CM | POA: Diagnosis not present

## 2016-12-03 DIAGNOSIS — B1920 Unspecified viral hepatitis C without hepatic coma: Secondary | ICD-10-CM | POA: Diagnosis not present

## 2016-12-03 DIAGNOSIS — M8580 Other specified disorders of bone density and structure, unspecified site: Secondary | ICD-10-CM | POA: Diagnosis not present

## 2016-12-03 DIAGNOSIS — R05 Cough: Secondary | ICD-10-CM | POA: Diagnosis not present

## 2016-12-03 DIAGNOSIS — M47816 Spondylosis without myelopathy or radiculopathy, lumbar region: Secondary | ICD-10-CM | POA: Diagnosis not present

## 2016-12-08 DIAGNOSIS — I10 Essential (primary) hypertension: Secondary | ICD-10-CM | POA: Diagnosis not present

## 2016-12-08 DIAGNOSIS — J441 Chronic obstructive pulmonary disease with (acute) exacerbation: Secondary | ICD-10-CM | POA: Diagnosis not present

## 2016-12-08 DIAGNOSIS — Z96649 Presence of unspecified artificial hip joint: Secondary | ICD-10-CM | POA: Diagnosis not present

## 2016-12-08 DIAGNOSIS — B1920 Unspecified viral hepatitis C without hepatic coma: Secondary | ICD-10-CM | POA: Diagnosis not present

## 2017-01-05 DIAGNOSIS — B1920 Unspecified viral hepatitis C without hepatic coma: Secondary | ICD-10-CM | POA: Diagnosis not present

## 2017-01-05 DIAGNOSIS — F902 Attention-deficit hyperactivity disorder, combined type: Secondary | ICD-10-CM | POA: Diagnosis not present

## 2017-01-05 DIAGNOSIS — I10 Essential (primary) hypertension: Secondary | ICD-10-CM | POA: Diagnosis not present

## 2017-01-05 DIAGNOSIS — Z96649 Presence of unspecified artificial hip joint: Secondary | ICD-10-CM | POA: Diagnosis not present

## 2017-02-02 DIAGNOSIS — F902 Attention-deficit hyperactivity disorder, combined type: Secondary | ICD-10-CM | POA: Diagnosis not present

## 2017-02-02 DIAGNOSIS — B1920 Unspecified viral hepatitis C without hepatic coma: Secondary | ICD-10-CM | POA: Diagnosis not present

## 2017-02-02 DIAGNOSIS — I10 Essential (primary) hypertension: Secondary | ICD-10-CM | POA: Diagnosis not present

## 2017-03-02 DIAGNOSIS — B1920 Unspecified viral hepatitis C without hepatic coma: Secondary | ICD-10-CM | POA: Diagnosis not present

## 2017-03-02 DIAGNOSIS — Z96649 Presence of unspecified artificial hip joint: Secondary | ICD-10-CM | POA: Diagnosis not present

## 2017-03-02 DIAGNOSIS — I10 Essential (primary) hypertension: Secondary | ICD-10-CM | POA: Diagnosis not present

## 2017-03-02 DIAGNOSIS — Z1389 Encounter for screening for other disorder: Secondary | ICD-10-CM | POA: Diagnosis not present

## 2017-04-05 DIAGNOSIS — F902 Attention-deficit hyperactivity disorder, combined type: Secondary | ICD-10-CM | POA: Diagnosis not present

## 2017-04-05 DIAGNOSIS — Z96649 Presence of unspecified artificial hip joint: Secondary | ICD-10-CM | POA: Diagnosis not present

## 2017-04-05 DIAGNOSIS — I10 Essential (primary) hypertension: Secondary | ICD-10-CM | POA: Diagnosis not present

## 2017-04-05 DIAGNOSIS — B1920 Unspecified viral hepatitis C without hepatic coma: Secondary | ICD-10-CM | POA: Diagnosis not present

## 2017-05-03 DIAGNOSIS — B1920 Unspecified viral hepatitis C without hepatic coma: Secondary | ICD-10-CM | POA: Diagnosis not present

## 2017-05-03 DIAGNOSIS — I10 Essential (primary) hypertension: Secondary | ICD-10-CM | POA: Diagnosis not present

## 2017-05-03 DIAGNOSIS — Z96649 Presence of unspecified artificial hip joint: Secondary | ICD-10-CM | POA: Diagnosis not present

## 2017-05-03 DIAGNOSIS — F902 Attention-deficit hyperactivity disorder, combined type: Secondary | ICD-10-CM | POA: Diagnosis not present

## 2017-05-18 DIAGNOSIS — R5381 Other malaise: Secondary | ICD-10-CM | POA: Diagnosis not present

## 2017-05-18 DIAGNOSIS — G4751 Confusional arousals: Secondary | ICD-10-CM | POA: Diagnosis not present

## 2017-05-18 DIAGNOSIS — I10 Essential (primary) hypertension: Secondary | ICD-10-CM | POA: Diagnosis not present

## 2017-05-19 DIAGNOSIS — B1920 Unspecified viral hepatitis C without hepatic coma: Secondary | ICD-10-CM | POA: Diagnosis not present

## 2017-05-19 DIAGNOSIS — Z96649 Presence of unspecified artificial hip joint: Secondary | ICD-10-CM | POA: Diagnosis not present

## 2017-05-19 DIAGNOSIS — I10 Essential (primary) hypertension: Secondary | ICD-10-CM | POA: Diagnosis not present

## 2017-05-19 DIAGNOSIS — F902 Attention-deficit hyperactivity disorder, combined type: Secondary | ICD-10-CM | POA: Diagnosis not present

## 2017-06-10 DIAGNOSIS — Z96649 Presence of unspecified artificial hip joint: Secondary | ICD-10-CM | POA: Diagnosis not present

## 2017-06-10 DIAGNOSIS — I10 Essential (primary) hypertension: Secondary | ICD-10-CM | POA: Diagnosis not present

## 2017-06-10 DIAGNOSIS — F902 Attention-deficit hyperactivity disorder, combined type: Secondary | ICD-10-CM | POA: Diagnosis not present

## 2017-06-10 DIAGNOSIS — B1920 Unspecified viral hepatitis C without hepatic coma: Secondary | ICD-10-CM | POA: Diagnosis not present

## 2017-06-23 DIAGNOSIS — L570 Actinic keratosis: Secondary | ICD-10-CM | POA: Diagnosis not present

## 2017-06-23 DIAGNOSIS — L821 Other seborrheic keratosis: Secondary | ICD-10-CM | POA: Diagnosis not present

## 2017-06-23 DIAGNOSIS — L298 Other pruritus: Secondary | ICD-10-CM | POA: Diagnosis not present

## 2017-06-23 DIAGNOSIS — R208 Other disturbances of skin sensation: Secondary | ICD-10-CM | POA: Diagnosis not present

## 2017-06-23 DIAGNOSIS — L820 Inflamed seborrheic keratosis: Secondary | ICD-10-CM | POA: Diagnosis not present

## 2017-06-23 DIAGNOSIS — L814 Other melanin hyperpigmentation: Secondary | ICD-10-CM | POA: Diagnosis not present

## 2017-06-23 DIAGNOSIS — X32XXXA Exposure to sunlight, initial encounter: Secondary | ICD-10-CM | POA: Diagnosis not present

## 2017-06-23 DIAGNOSIS — L538 Other specified erythematous conditions: Secondary | ICD-10-CM | POA: Diagnosis not present

## 2017-06-23 DIAGNOSIS — L908 Other atrophic disorders of skin: Secondary | ICD-10-CM | POA: Diagnosis not present

## 2017-07-02 DIAGNOSIS — B1920 Unspecified viral hepatitis C without hepatic coma: Secondary | ICD-10-CM | POA: Diagnosis not present

## 2017-07-02 DIAGNOSIS — G319 Degenerative disease of nervous system, unspecified: Secondary | ICD-10-CM | POA: Diagnosis not present

## 2017-07-02 DIAGNOSIS — F902 Attention-deficit hyperactivity disorder, combined type: Secondary | ICD-10-CM | POA: Diagnosis not present

## 2017-07-02 DIAGNOSIS — I10 Essential (primary) hypertension: Secondary | ICD-10-CM | POA: Diagnosis not present

## 2017-07-30 DIAGNOSIS — B1920 Unspecified viral hepatitis C without hepatic coma: Secondary | ICD-10-CM | POA: Diagnosis not present

## 2017-07-30 DIAGNOSIS — D649 Anemia, unspecified: Secondary | ICD-10-CM | POA: Diagnosis not present

## 2017-07-30 DIAGNOSIS — Z96649 Presence of unspecified artificial hip joint: Secondary | ICD-10-CM | POA: Diagnosis not present

## 2017-07-30 DIAGNOSIS — I10 Essential (primary) hypertension: Secondary | ICD-10-CM | POA: Diagnosis not present

## 2017-08-27 DIAGNOSIS — I10 Essential (primary) hypertension: Secondary | ICD-10-CM | POA: Diagnosis not present

## 2017-08-27 DIAGNOSIS — Z96649 Presence of unspecified artificial hip joint: Secondary | ICD-10-CM | POA: Diagnosis not present

## 2017-08-27 DIAGNOSIS — F902 Attention-deficit hyperactivity disorder, combined type: Secondary | ICD-10-CM | POA: Diagnosis not present

## 2017-08-27 DIAGNOSIS — B1920 Unspecified viral hepatitis C without hepatic coma: Secondary | ICD-10-CM | POA: Diagnosis not present

## 2017-09-22 DIAGNOSIS — L570 Actinic keratosis: Secondary | ICD-10-CM | POA: Diagnosis not present

## 2017-09-22 DIAGNOSIS — D485 Neoplasm of uncertain behavior of skin: Secondary | ICD-10-CM | POA: Diagnosis not present

## 2017-09-23 DIAGNOSIS — Z96649 Presence of unspecified artificial hip joint: Secondary | ICD-10-CM | POA: Diagnosis not present

## 2017-09-23 DIAGNOSIS — F902 Attention-deficit hyperactivity disorder, combined type: Secondary | ICD-10-CM | POA: Diagnosis not present

## 2017-09-23 DIAGNOSIS — D649 Anemia, unspecified: Secondary | ICD-10-CM | POA: Diagnosis not present

## 2017-09-23 DIAGNOSIS — I10 Essential (primary) hypertension: Secondary | ICD-10-CM | POA: Diagnosis not present

## 2017-10-22 DIAGNOSIS — F902 Attention-deficit hyperactivity disorder, combined type: Secondary | ICD-10-CM | POA: Diagnosis not present

## 2017-10-22 DIAGNOSIS — B1920 Unspecified viral hepatitis C without hepatic coma: Secondary | ICD-10-CM | POA: Diagnosis not present

## 2017-10-22 DIAGNOSIS — I10 Essential (primary) hypertension: Secondary | ICD-10-CM | POA: Diagnosis not present

## 2017-10-22 DIAGNOSIS — D649 Anemia, unspecified: Secondary | ICD-10-CM | POA: Diagnosis not present

## 2017-10-27 DIAGNOSIS — L570 Actinic keratosis: Secondary | ICD-10-CM | POA: Diagnosis not present

## 2017-11-22 DIAGNOSIS — B1920 Unspecified viral hepatitis C without hepatic coma: Secondary | ICD-10-CM | POA: Diagnosis not present

## 2017-11-22 DIAGNOSIS — R000 Tachycardia, unspecified: Secondary | ICD-10-CM | POA: Diagnosis not present

## 2017-11-22 DIAGNOSIS — F902 Attention-deficit hyperactivity disorder, combined type: Secondary | ICD-10-CM | POA: Diagnosis not present

## 2017-11-22 DIAGNOSIS — D649 Anemia, unspecified: Secondary | ICD-10-CM | POA: Diagnosis not present

## 2017-12-10 DIAGNOSIS — F902 Attention-deficit hyperactivity disorder, combined type: Secondary | ICD-10-CM | POA: Diagnosis not present

## 2017-12-10 DIAGNOSIS — R000 Tachycardia, unspecified: Secondary | ICD-10-CM | POA: Diagnosis not present

## 2017-12-10 DIAGNOSIS — D649 Anemia, unspecified: Secondary | ICD-10-CM | POA: Diagnosis not present

## 2017-12-10 DIAGNOSIS — Z96649 Presence of unspecified artificial hip joint: Secondary | ICD-10-CM | POA: Diagnosis not present

## 2017-12-14 DIAGNOSIS — I10 Essential (primary) hypertension: Secondary | ICD-10-CM | POA: Diagnosis not present

## 2017-12-14 DIAGNOSIS — D649 Anemia, unspecified: Secondary | ICD-10-CM | POA: Diagnosis not present

## 2017-12-14 DIAGNOSIS — F902 Attention-deficit hyperactivity disorder, combined type: Secondary | ICD-10-CM | POA: Diagnosis not present

## 2017-12-14 DIAGNOSIS — Z96649 Presence of unspecified artificial hip joint: Secondary | ICD-10-CM | POA: Diagnosis not present

## 2017-12-21 DIAGNOSIS — L570 Actinic keratosis: Secondary | ICD-10-CM | POA: Diagnosis not present

## 2017-12-21 DIAGNOSIS — X32XXXA Exposure to sunlight, initial encounter: Secondary | ICD-10-CM | POA: Diagnosis not present

## 2017-12-21 DIAGNOSIS — L814 Other melanin hyperpigmentation: Secondary | ICD-10-CM | POA: Diagnosis not present

## 2018-01-14 DIAGNOSIS — I10 Essential (primary) hypertension: Secondary | ICD-10-CM | POA: Diagnosis not present

## 2018-01-14 DIAGNOSIS — J101 Influenza due to other identified influenza virus with other respiratory manifestations: Secondary | ICD-10-CM | POA: Diagnosis not present

## 2018-01-14 DIAGNOSIS — B1920 Unspecified viral hepatitis C without hepatic coma: Secondary | ICD-10-CM | POA: Diagnosis not present

## 2018-01-14 DIAGNOSIS — D649 Anemia, unspecified: Secondary | ICD-10-CM | POA: Diagnosis not present

## 2018-01-14 DIAGNOSIS — F902 Attention-deficit hyperactivity disorder, combined type: Secondary | ICD-10-CM | POA: Diagnosis not present

## 2018-02-09 DIAGNOSIS — I10 Essential (primary) hypertension: Secondary | ICD-10-CM | POA: Diagnosis not present

## 2018-02-09 DIAGNOSIS — F902 Attention-deficit hyperactivity disorder, combined type: Secondary | ICD-10-CM | POA: Diagnosis not present

## 2018-02-09 DIAGNOSIS — Z96649 Presence of unspecified artificial hip joint: Secondary | ICD-10-CM | POA: Diagnosis not present

## 2018-03-11 ENCOUNTER — Ambulatory Visit
Admission: RE | Admit: 2018-03-11 | Discharge: 2018-03-11 | Disposition: A | Discharge status: Home / Self Care | Payer: Medicare Other | Source: Ambulatory Visit | Attending: Internal Medicine | Admitting: Internal Medicine

## 2018-03-11 ENCOUNTER — Other Ambulatory Visit: Payer: Self-pay | Admitting: Internal Medicine

## 2018-03-11 ENCOUNTER — Other Ambulatory Visit
Admission: RE | Admit: 2018-03-11 | Discharge: 2018-03-11 | Disposition: A | Discharge status: Home / Self Care | Payer: Medicare Other | Source: Ambulatory Visit | Attending: Internal Medicine | Admitting: Internal Medicine

## 2018-03-11 DIAGNOSIS — B1920 Unspecified viral hepatitis C without hepatic coma: Secondary | ICD-10-CM | POA: Diagnosis not present

## 2018-03-11 DIAGNOSIS — R0602 Shortness of breath: Secondary | ICD-10-CM | POA: Insufficient documentation

## 2018-03-11 DIAGNOSIS — J209 Acute bronchitis, unspecified: Secondary | ICD-10-CM | POA: Diagnosis not present

## 2018-03-11 DIAGNOSIS — J40 Bronchitis, not specified as acute or chronic: Secondary | ICD-10-CM | POA: Insufficient documentation

## 2018-03-11 DIAGNOSIS — F902 Attention-deficit hyperactivity disorder, combined type: Secondary | ICD-10-CM | POA: Diagnosis not present

## 2018-03-11 DIAGNOSIS — D649 Anemia, unspecified: Secondary | ICD-10-CM | POA: Diagnosis not present

## 2018-03-11 LAB — CBC
HCT: 40.7 % (ref 35.0–47.0)
Hemoglobin: 13.3 g/dL (ref 12.0–16.0)
MCH: 29.2 pg (ref 26.0–34.0)
MCHC: 32.7 g/dL (ref 32.0–36.0)
MCV: 89.4 fL (ref 80.0–100.0)
Platelets: 532 10*3/uL — ABNORMAL HIGH (ref 150–440)
RBC: 4.56 MIL/uL (ref 3.80–5.20)
RDW: 16.2 % — ABNORMAL HIGH (ref 11.5–14.5)
WBC: 12.2 10*3/uL — ABNORMAL HIGH (ref 3.6–11.0)

## 2018-03-11 LAB — BASIC METABOLIC PANEL
Anion gap: 11 (ref 5–15)
BUN: 11 mg/dL (ref 6–20)
CO2: 24 mmol/L (ref 22–32)
Calcium: 9.3 mg/dL (ref 8.9–10.3)
Chloride: 99 mmol/L — ABNORMAL LOW (ref 101–111)
Creatinine, Ser: 0.65 mg/dL (ref 0.44–1.00)
GFR calc Af Amer: >60 mL/min (ref >60)
GFR calc non Af Amer: >60 mL/min (ref >60)
Glucose, Bld: 113 mg/dL — ABNORMAL HIGH (ref 65–99)
Potassium: 3.6 mmol/L (ref 3.5–5.1)
Sodium: 134 mmol/L — ABNORMAL LOW (ref 135–145)

## 2018-03-11 LAB — TSH: TSH: 1.284 u[IU]/mL (ref 0.350–4.500)

## 2018-03-22 DIAGNOSIS — A319 Mycobacterial infection, unspecified: Secondary | ICD-10-CM | POA: Diagnosis not present

## 2018-03-22 DIAGNOSIS — I10 Essential (primary) hypertension: Secondary | ICD-10-CM | POA: Diagnosis not present

## 2018-03-22 DIAGNOSIS — D649 Anemia, unspecified: Secondary | ICD-10-CM | POA: Diagnosis not present

## 2018-03-22 DIAGNOSIS — F902 Attention-deficit hyperactivity disorder, combined type: Secondary | ICD-10-CM | POA: Diagnosis not present

## 2018-03-24 ENCOUNTER — Other Ambulatory Visit: Payer: Self-pay | Admitting: Internal Medicine

## 2018-03-24 DIAGNOSIS — R9389 Abnormal findings on diagnostic imaging of other specified body structures: Secondary | ICD-10-CM

## 2018-03-29 ENCOUNTER — Ambulatory Visit
Admission: RE | Admit: 2018-03-29 | Discharge: 2018-03-29 | Disposition: A | Discharge status: Home / Self Care | Payer: Medicare Other | Source: Ambulatory Visit | Attending: Internal Medicine | Admitting: Internal Medicine

## 2018-03-29 DIAGNOSIS — J432 Centrilobular emphysema: Secondary | ICD-10-CM | POA: Diagnosis not present

## 2018-03-29 DIAGNOSIS — K229 Disease of esophagus, unspecified: Secondary | ICD-10-CM | POA: Insufficient documentation

## 2018-03-29 DIAGNOSIS — R9389 Abnormal findings on diagnostic imaging of other specified body structures: Secondary | ICD-10-CM | POA: Insufficient documentation

## 2018-03-29 DIAGNOSIS — Z87891 Personal history of nicotine dependence: Secondary | ICD-10-CM | POA: Diagnosis not present

## 2018-03-29 DIAGNOSIS — K449 Diaphragmatic hernia without obstruction or gangrene: Secondary | ICD-10-CM | POA: Insufficient documentation

## 2018-03-29 DIAGNOSIS — R911 Solitary pulmonary nodule: Secondary | ICD-10-CM | POA: Diagnosis not present

## 2018-04-04 DIAGNOSIS — Z96649 Presence of unspecified artificial hip joint: Secondary | ICD-10-CM | POA: Diagnosis not present

## 2018-04-04 DIAGNOSIS — D649 Anemia, unspecified: Secondary | ICD-10-CM | POA: Diagnosis not present

## 2018-04-04 DIAGNOSIS — A319 Mycobacterial infection, unspecified: Secondary | ICD-10-CM | POA: Diagnosis not present

## 2018-04-04 DIAGNOSIS — F902 Attention-deficit hyperactivity disorder, combined type: Secondary | ICD-10-CM | POA: Diagnosis not present

## 2018-04-20 DIAGNOSIS — R918 Other nonspecific abnormal finding of lung field: Secondary | ICD-10-CM | POA: Diagnosis not present

## 2018-04-20 DIAGNOSIS — J432 Centrilobular emphysema: Secondary | ICD-10-CM | POA: Diagnosis not present

## 2018-04-20 DIAGNOSIS — R9389 Abnormal findings on diagnostic imaging of other specified body structures: Secondary | ICD-10-CM | POA: Diagnosis not present

## 2018-04-20 DIAGNOSIS — K449 Diaphragmatic hernia without obstruction or gangrene: Secondary | ICD-10-CM | POA: Diagnosis not present

## 2018-04-22 DIAGNOSIS — J189 Pneumonia, unspecified organism: Secondary | ICD-10-CM | POA: Diagnosis not present

## 2018-04-22 DIAGNOSIS — R918 Other nonspecific abnormal finding of lung field: Secondary | ICD-10-CM | POA: Diagnosis not present

## 2018-04-22 DIAGNOSIS — J438 Other emphysema: Secondary | ICD-10-CM | POA: Diagnosis not present

## 2018-04-22 DIAGNOSIS — J849 Interstitial pulmonary disease, unspecified: Secondary | ICD-10-CM | POA: Diagnosis not present

## 2018-05-04 DIAGNOSIS — A319 Mycobacterial infection, unspecified: Secondary | ICD-10-CM | POA: Diagnosis not present

## 2018-05-04 DIAGNOSIS — Z96649 Presence of unspecified artificial hip joint: Secondary | ICD-10-CM | POA: Diagnosis not present

## 2018-05-04 DIAGNOSIS — I10 Essential (primary) hypertension: Secondary | ICD-10-CM | POA: Diagnosis not present

## 2018-05-04 DIAGNOSIS — B1920 Unspecified viral hepatitis C without hepatic coma: Secondary | ICD-10-CM | POA: Diagnosis not present

## 2018-05-27 DIAGNOSIS — J849 Interstitial pulmonary disease, unspecified: Secondary | ICD-10-CM | POA: Diagnosis not present

## 2018-05-27 DIAGNOSIS — J439 Emphysema, unspecified: Secondary | ICD-10-CM | POA: Diagnosis not present

## 2018-05-27 DIAGNOSIS — J189 Pneumonia, unspecified organism: Secondary | ICD-10-CM | POA: Diagnosis not present

## 2018-05-27 DIAGNOSIS — J8410 Pulmonary fibrosis, unspecified: Secondary | ICD-10-CM | POA: Diagnosis not present

## 2018-05-27 DIAGNOSIS — R918 Other nonspecific abnormal finding of lung field: Secondary | ICD-10-CM | POA: Diagnosis not present

## 2018-06-01 DIAGNOSIS — F902 Attention-deficit hyperactivity disorder, combined type: Secondary | ICD-10-CM | POA: Diagnosis not present

## 2018-06-01 DIAGNOSIS — D649 Anemia, unspecified: Secondary | ICD-10-CM | POA: Diagnosis not present

## 2018-06-01 DIAGNOSIS — R911 Solitary pulmonary nodule: Secondary | ICD-10-CM | POA: Diagnosis not present

## 2018-06-01 DIAGNOSIS — Z96649 Presence of unspecified artificial hip joint: Secondary | ICD-10-CM | POA: Diagnosis not present

## 2018-06-03 DIAGNOSIS — R0609 Other forms of dyspnea: Secondary | ICD-10-CM | POA: Diagnosis not present

## 2018-06-03 DIAGNOSIS — R911 Solitary pulmonary nodule: Secondary | ICD-10-CM | POA: Diagnosis not present

## 2018-06-03 DIAGNOSIS — J439 Emphysema, unspecified: Secondary | ICD-10-CM | POA: Diagnosis not present

## 2018-06-03 DIAGNOSIS — J189 Pneumonia, unspecified organism: Secondary | ICD-10-CM | POA: Diagnosis not present

## 2018-06-03 DIAGNOSIS — J8410 Pulmonary fibrosis, unspecified: Secondary | ICD-10-CM | POA: Diagnosis not present

## 2018-06-10 DIAGNOSIS — R911 Solitary pulmonary nodule: Secondary | ICD-10-CM | POA: Diagnosis not present

## 2018-06-17 DIAGNOSIS — J8410 Pulmonary fibrosis, unspecified: Secondary | ICD-10-CM | POA: Diagnosis not present

## 2018-06-17 DIAGNOSIS — I10 Essential (primary) hypertension: Secondary | ICD-10-CM | POA: Diagnosis not present

## 2018-06-17 DIAGNOSIS — F320 Major depressive disorder, single episode, mild: Secondary | ICD-10-CM | POA: Diagnosis not present

## 2018-06-17 DIAGNOSIS — J189 Pneumonia, unspecified organism: Secondary | ICD-10-CM | POA: Diagnosis not present

## 2018-06-17 DIAGNOSIS — R911 Solitary pulmonary nodule: Secondary | ICD-10-CM | POA: Diagnosis not present

## 2018-06-17 DIAGNOSIS — D509 Iron deficiency anemia, unspecified: Secondary | ICD-10-CM | POA: Diagnosis not present

## 2018-06-17 DIAGNOSIS — Z96641 Presence of right artificial hip joint: Secondary | ICD-10-CM | POA: Diagnosis not present

## 2018-06-17 DIAGNOSIS — R9389 Abnormal findings on diagnostic imaging of other specified body structures: Secondary | ICD-10-CM | POA: Diagnosis not present

## 2018-06-17 DIAGNOSIS — F909 Attention-deficit hyperactivity disorder, unspecified type: Secondary | ICD-10-CM | POA: Diagnosis not present

## 2018-06-17 DIAGNOSIS — Z96642 Presence of left artificial hip joint: Secondary | ICD-10-CM | POA: Diagnosis not present

## 2018-06-17 DIAGNOSIS — J439 Emphysema, unspecified: Secondary | ICD-10-CM | POA: Diagnosis not present

## 2018-06-20 DIAGNOSIS — C3431 Malignant neoplasm of lower lobe, right bronchus or lung: Secondary | ICD-10-CM | POA: Diagnosis not present

## 2018-06-20 DIAGNOSIS — Z96643 Presence of artificial hip joint, bilateral: Secondary | ICD-10-CM | POA: Diagnosis not present

## 2018-06-20 DIAGNOSIS — R918 Other nonspecific abnormal finding of lung field: Secondary | ICD-10-CM | POA: Diagnosis not present

## 2018-06-20 DIAGNOSIS — I10 Essential (primary) hypertension: Secondary | ICD-10-CM | POA: Diagnosis not present

## 2018-06-20 DIAGNOSIS — C771 Secondary and unspecified malignant neoplasm of intrathoracic lymph nodes: Secondary | ICD-10-CM | POA: Diagnosis not present

## 2018-06-20 DIAGNOSIS — J8410 Pulmonary fibrosis, unspecified: Secondary | ICD-10-CM | POA: Diagnosis not present

## 2018-06-20 DIAGNOSIS — Z880 Allergy status to penicillin: Secondary | ICD-10-CM | POA: Diagnosis not present

## 2018-06-20 DIAGNOSIS — Z79899 Other long term (current) drug therapy: Secondary | ICD-10-CM | POA: Diagnosis not present

## 2018-06-20 DIAGNOSIS — R590 Localized enlarged lymph nodes: Secondary | ICD-10-CM | POA: Diagnosis not present

## 2018-06-20 DIAGNOSIS — Z888 Allergy status to other drugs, medicaments and biological substances status: Secondary | ICD-10-CM | POA: Diagnosis not present

## 2018-06-20 DIAGNOSIS — J439 Emphysema, unspecified: Secondary | ICD-10-CM | POA: Diagnosis not present

## 2018-06-20 DIAGNOSIS — Z885 Allergy status to narcotic agent status: Secondary | ICD-10-CM | POA: Diagnosis not present

## 2018-06-20 DIAGNOSIS — R911 Solitary pulmonary nodule: Secondary | ICD-10-CM | POA: Diagnosis not present

## 2018-06-20 DIAGNOSIS — Z87891 Personal history of nicotine dependence: Secondary | ICD-10-CM | POA: Diagnosis not present

## 2018-06-27 DIAGNOSIS — I10 Essential (primary) hypertension: Secondary | ICD-10-CM | POA: Diagnosis not present

## 2018-06-27 DIAGNOSIS — B1920 Unspecified viral hepatitis C without hepatic coma: Secondary | ICD-10-CM | POA: Diagnosis not present

## 2018-06-27 DIAGNOSIS — C3490 Malignant neoplasm of unspecified part of unspecified bronchus or lung: Secondary | ICD-10-CM | POA: Diagnosis not present

## 2018-06-27 DIAGNOSIS — A319 Mycobacterial infection, unspecified: Secondary | ICD-10-CM | POA: Diagnosis not present

## 2018-06-28 DIAGNOSIS — J8410 Pulmonary fibrosis, unspecified: Secondary | ICD-10-CM | POA: Diagnosis not present

## 2018-06-28 DIAGNOSIS — C3491 Malignant neoplasm of unspecified part of right bronchus or lung: Secondary | ICD-10-CM | POA: Diagnosis not present

## 2018-06-28 DIAGNOSIS — J439 Emphysema, unspecified: Secondary | ICD-10-CM | POA: Diagnosis not present

## 2018-07-01 DIAGNOSIS — B1920 Unspecified viral hepatitis C without hepatic coma: Secondary | ICD-10-CM | POA: Diagnosis not present

## 2018-07-01 DIAGNOSIS — F902 Attention-deficit hyperactivity disorder, combined type: Secondary | ICD-10-CM | POA: Diagnosis not present

## 2018-07-01 DIAGNOSIS — D649 Anemia, unspecified: Secondary | ICD-10-CM | POA: Diagnosis not present

## 2018-07-01 DIAGNOSIS — R0602 Shortness of breath: Secondary | ICD-10-CM | POA: Diagnosis not present

## 2018-07-01 DIAGNOSIS — I10 Essential (primary) hypertension: Secondary | ICD-10-CM | POA: Diagnosis not present

## 2018-07-04 DIAGNOSIS — C3491 Malignant neoplasm of unspecified part of right bronchus or lung: Secondary | ICD-10-CM | POA: Diagnosis not present

## 2018-07-04 DIAGNOSIS — Z87891 Personal history of nicotine dependence: Secondary | ICD-10-CM | POA: Diagnosis not present

## 2018-07-11 DIAGNOSIS — C3491 Malignant neoplasm of unspecified part of right bronchus or lung: Secondary | ICD-10-CM | POA: Diagnosis not present

## 2018-07-11 DIAGNOSIS — C3490 Malignant neoplasm of unspecified part of unspecified bronchus or lung: Secondary | ICD-10-CM | POA: Diagnosis not present

## 2018-07-11 DIAGNOSIS — Z87891 Personal history of nicotine dependence: Secondary | ICD-10-CM | POA: Diagnosis not present

## 2018-07-11 DIAGNOSIS — C7931 Secondary malignant neoplasm of brain: Secondary | ICD-10-CM | POA: Diagnosis not present

## 2018-07-11 DIAGNOSIS — R5383 Other fatigue: Secondary | ICD-10-CM | POA: Diagnosis not present

## 2018-07-11 DIAGNOSIS — C3431 Malignant neoplasm of lower lobe, right bronchus or lung: Secondary | ICD-10-CM | POA: Diagnosis not present

## 2018-07-20 DIAGNOSIS — C7931 Secondary malignant neoplasm of brain: Secondary | ICD-10-CM | POA: Diagnosis not present

## 2018-07-25 DIAGNOSIS — C7931 Secondary malignant neoplasm of brain: Secondary | ICD-10-CM | POA: Diagnosis not present

## 2018-07-27 DIAGNOSIS — Z91041 Radiographic dye allergy status: Secondary | ICD-10-CM | POA: Diagnosis not present

## 2018-07-27 DIAGNOSIS — C7931 Secondary malignant neoplasm of brain: Secondary | ICD-10-CM | POA: Diagnosis not present

## 2018-08-01 DIAGNOSIS — Z87891 Personal history of nicotine dependence: Secondary | ICD-10-CM | POA: Diagnosis not present

## 2018-08-01 DIAGNOSIS — Z5112 Encounter for antineoplastic immunotherapy: Secondary | ICD-10-CM | POA: Diagnosis not present

## 2018-08-01 DIAGNOSIS — C3411 Malignant neoplasm of upper lobe, right bronchus or lung: Secondary | ICD-10-CM | POA: Diagnosis not present

## 2018-08-01 DIAGNOSIS — C7931 Secondary malignant neoplasm of brain: Secondary | ICD-10-CM | POA: Diagnosis not present

## 2018-08-01 DIAGNOSIS — Z923 Personal history of irradiation: Secondary | ICD-10-CM | POA: Diagnosis not present

## 2018-08-01 DIAGNOSIS — C3491 Malignant neoplasm of unspecified part of right bronchus or lung: Secondary | ICD-10-CM | POA: Diagnosis not present

## 2018-08-19 DIAGNOSIS — B1920 Unspecified viral hepatitis C without hepatic coma: Secondary | ICD-10-CM | POA: Diagnosis not present

## 2018-08-19 DIAGNOSIS — R911 Solitary pulmonary nodule: Secondary | ICD-10-CM | POA: Diagnosis not present

## 2018-08-19 DIAGNOSIS — D649 Anemia, unspecified: Secondary | ICD-10-CM | POA: Diagnosis not present

## 2018-08-19 DIAGNOSIS — I10 Essential (primary) hypertension: Secondary | ICD-10-CM | POA: Diagnosis not present

## 2018-08-24 DIAGNOSIS — Z923 Personal history of irradiation: Secondary | ICD-10-CM | POA: Diagnosis not present

## 2018-08-24 DIAGNOSIS — C7931 Secondary malignant neoplasm of brain: Secondary | ICD-10-CM | POA: Diagnosis not present

## 2018-08-24 DIAGNOSIS — Z23 Encounter for immunization: Secondary | ICD-10-CM | POA: Diagnosis not present

## 2018-08-24 DIAGNOSIS — R252 Cramp and spasm: Secondary | ICD-10-CM | POA: Diagnosis not present

## 2018-08-24 DIAGNOSIS — Z5112 Encounter for antineoplastic immunotherapy: Secondary | ICD-10-CM | POA: Diagnosis not present

## 2018-08-24 DIAGNOSIS — Z87891 Personal history of nicotine dependence: Secondary | ICD-10-CM | POA: Diagnosis not present

## 2018-08-24 DIAGNOSIS — C3491 Malignant neoplasm of unspecified part of right bronchus or lung: Secondary | ICD-10-CM | POA: Diagnosis not present

## 2018-08-24 DIAGNOSIS — C3431 Malignant neoplasm of lower lobe, right bronchus or lung: Secondary | ICD-10-CM | POA: Diagnosis not present

## 2018-08-24 DIAGNOSIS — C771 Secondary and unspecified malignant neoplasm of intrathoracic lymph nodes: Secondary | ICD-10-CM | POA: Diagnosis not present

## 2018-08-30 DIAGNOSIS — I10 Essential (primary) hypertension: Secondary | ICD-10-CM | POA: Diagnosis not present

## 2018-08-30 DIAGNOSIS — B1920 Unspecified viral hepatitis C without hepatic coma: Secondary | ICD-10-CM | POA: Diagnosis not present

## 2018-08-30 DIAGNOSIS — A319 Mycobacterial infection, unspecified: Secondary | ICD-10-CM | POA: Diagnosis not present

## 2018-08-30 DIAGNOSIS — D649 Anemia, unspecified: Secondary | ICD-10-CM | POA: Diagnosis not present

## 2018-09-06 ENCOUNTER — Emergency Department
Admission: EM | Admit: 2018-09-06 | Discharge: 2018-09-06 | Disposition: A | Discharge status: Home / Self Care | Payer: Medicare Other | Attending: Emergency Medicine | Admitting: Emergency Medicine

## 2018-09-06 ENCOUNTER — Other Ambulatory Visit: Payer: Self-pay

## 2018-09-06 ENCOUNTER — Emergency Department: Payer: Medicare Other

## 2018-09-06 ENCOUNTER — Encounter: Payer: Self-pay | Admitting: Emergency Medicine

## 2018-09-06 DIAGNOSIS — Z79899 Other long term (current) drug therapy: Secondary | ICD-10-CM | POA: Diagnosis not present

## 2018-09-06 DIAGNOSIS — F909 Attention-deficit hyperactivity disorder, unspecified type: Secondary | ICD-10-CM | POA: Insufficient documentation

## 2018-09-06 DIAGNOSIS — R609 Edema, unspecified: Secondary | ICD-10-CM

## 2018-09-06 DIAGNOSIS — R600 Localized edema: Secondary | ICD-10-CM | POA: Diagnosis not present

## 2018-09-06 DIAGNOSIS — I10 Essential (primary) hypertension: Secondary | ICD-10-CM | POA: Diagnosis not present

## 2018-09-06 DIAGNOSIS — Z87891 Personal history of nicotine dependence: Secondary | ICD-10-CM | POA: Diagnosis not present

## 2018-09-06 DIAGNOSIS — R2241 Localized swelling, mass and lump, right lower limb: Secondary | ICD-10-CM | POA: Diagnosis present

## 2018-09-06 NOTE — ED Provider Notes (Signed)
[facility] Emergency Department Provider Note  Time seen: 4:01 PM  I have reviewed the triage vital signs and the nursing notes.   HISTORY  Chief Complaint Leg Swelling    HPI Marie Dunn is a 71 y.o. female with a past medical history of ADHD, hypertension, lung cancer, presents to the emergency department for leg swelling and discomfort.  According to the patient she is on her second round of chemotherapy, noted on Sunday after being up walking around for an extended period that her legs were somewhat swollen.  She noted yesterday that the swelling in the left leg had gone down but the right leg remained swollen and her calf was tender.  Called her doctor but they could not get her in for an ultrasound for several weeks, recommended she come to the emergency department as the patient was planning on leaving the area to go to the beach for vacation today.  Patient denies any chest pain or shortness of breath more than normal.  Continues to have mild swelling in the right lower extremity although states the pain is largely resolved.  Describes the pain as a mild dull aching type pain in the right calf.   Past Medical History:  Diagnosis Date  . ADHD (attention deficit hyperactivity disorder)   . Hypertension     Patient Active Problem List   Diagnosis Date Noted  . Adult attention deficit disorder 08/06/2015  . Mild depression ([place]) 08/06/2015  . BP (high blood pressure) 08/06/2015  . Arthritis, degenerative 08/06/2015  . Encounter to establish care 08/06/2015  . Chronic LBP 10/15/2014  . Arthralgia of hip 11/08/2012    Past Surgical History:  Procedure Laterality Date  . ABDOMINAL HYSTERECTOMY    . left ankle surgery    . left hip replacement    . TOTAL HIP ARTHROPLASTY Right     Prior to Admission medications   Medication Sig Start Date End Date Taking? Authorizing Provider  albuterol (PROVENTIL HFA;VENTOLIN HFA) 108 (90 Base) MCG/ACT inhaler  Inhale 1 puff into the lungs every 6 (six) hours as needed for wheezing or shortness of breath.    [provider]  hydrochlorothiazide (HYDRODIURIL) 25 MG tablet Take 1 tablet (25 mg total) by mouth daily. for blood pressure 08/06/15   Arlis Porta., MD  lisinopril (PRINIVIL,ZESTRIL) 10 MG tablet Take 1 tablet by mouth daily. 02/24/16   [provider]  Melatonin 5 MG TABS Take 1 tablet by mouth daily as needed.    [provider]    Allergies  Allergen Reactions  . Ampicillin Anaphylaxis  . Codeine Other (See Comments)  . Morphine And Related Rash    Other Reaction: Intolerance  . Amoxicillin Swelling  . Prednisone Swelling    No family history on file.  Social History Social History   Tobacco Use  . Smoking status: Former Research scientist (life sciences)  . Smokeless tobacco: Never Used  Substance Use Topics  . Alcohol use: No  . Drug use: No    Review of Systems Constitutional: Negative for fever. Cardiovascular: Negative for chest pain. Respiratory: Negative for shortness of breath. Gastrointestinal: Negative for abdominal pain, vomiting  Genitourinary: Negative for urinary compaints Musculoskeletal: Bilateral leg swelling, persistent right leg swelling with right calf pain. Skin: Negative for skin complaints  Neurological: Negative for headache All other ROS negative  ____________________________________________   PHYSICAL EXAM:  VITAL SIGNS: ED Triage Vitals  Enc Vitals Group     BP 09/06/18 1519 (!) 185/118  Pulse Rate 09/06/18 1519 97     Resp 09/06/18 1519 18     Temp 09/06/18 1519 (!) 97.5 F (36.4 C)     Temp Source 09/06/18 1519 Oral     SpO2 09/06/18 1519 97 %     Weight 09/06/18 1520 149 lb 14.6 oz (68 kg)     Height 09/06/18 1520 5' 3.5" (1.613 m)     Head Circumference --      Peak Flow --      Pain Score 09/06/18 1519 8     Pain Loc --      Pain Edu? --      Excl. in [place]? --     Constitutional: Alert and oriented. Well  appearing and in no distress. Eyes: Normal exam ENT   Head: Normocephalic and atraumatic.   Mouth/Throat: Mucous membranes are moist. Cardiovascular: Normal rate, regular rhythm. No murmur Respiratory: Normal respiratory effort without tachypnea nor retractions. Breath sounds are clear  Gastrointestinal: Soft and nontender. No distention.  Musculoskeletal: Nontender with normal range of motion in all extremities.  1+ lower extreme edema in the right lower extremity normal-appearing left lower extremity, no calf tenderness to palpation. Neurologic:  Normal speech and language. No gross focal neurologic deficits  Skin:  Skin is warm, dry and intact.  Psychiatric: Mood and affect are normal. ____________________________________________   RADIOLOGY  Ultrasound negative for DVT  ____________________________________________   INITIAL IMPRESSION / ASSESSMENT AND PLAN / ED COURSE  Pertinent labs & imaging results that were available during my care of the patient were reviewed by me and considered in my medical decision making (see chart for details).  Patient presents to the emergency department for lower extremity pain swelling in the right lower extremity.  Differential would include peripheral edema, muscle strain, DVT.  Will obtain an ultrasound to help rule out DVT.  Patient states she was walking around substantially more than normal on Sunday when the swelling and discomfort for started.  No history of DVT in the past.  Ultrasound negative for DVT.  Patient will be discharged home with PCP follow-up.  ____________________________________________   FINAL CLINICAL IMPRESSION(S) / ED DIAGNOSES  Peripheral edema   Harvest Dark, MD 09/06/18 1658

## 2018-09-06 NOTE — ED Notes (Signed)
Pt states she is on [place] immunotherapy for stage 4 lung CA and went to [place] on Sunday with her grad child shopping and did a lot of shopping, states she noticed a lot of swelling BL LE more on there right noted. States her doctor at [place] recommended she have an ultrasound to r/o DVT before traveling to the beach to spend over the holidays.

## 2018-09-06 NOTE — ED Triage Notes (Signed)
PT ambulates with a steady independent gait to triage room. Pt states she is going to the beach and wants to rule out a DVT prior to her trip. PT states she noticed acute swelling Sunday, more prominent of the right side.

## 2018-09-13 DIAGNOSIS — A319 Mycobacterial infection, unspecified: Secondary | ICD-10-CM | POA: Diagnosis not present

## 2018-09-13 DIAGNOSIS — D649 Anemia, unspecified: Secondary | ICD-10-CM | POA: Diagnosis not present

## 2018-09-13 DIAGNOSIS — R911 Solitary pulmonary nodule: Secondary | ICD-10-CM | POA: Diagnosis not present

## 2018-09-13 DIAGNOSIS — F902 Attention-deficit hyperactivity disorder, combined type: Secondary | ICD-10-CM | POA: Diagnosis not present

## 2018-09-14 DIAGNOSIS — R634 Abnormal weight loss: Secondary | ICD-10-CM | POA: Diagnosis not present

## 2018-09-14 DIAGNOSIS — C7931 Secondary malignant neoplasm of brain: Secondary | ICD-10-CM | POA: Diagnosis not present

## 2018-09-14 DIAGNOSIS — C3491 Malignant neoplasm of unspecified part of right bronchus or lung: Secondary | ICD-10-CM | POA: Diagnosis not present

## 2018-09-14 DIAGNOSIS — I10 Essential (primary) hypertension: Secondary | ICD-10-CM | POA: Diagnosis not present

## 2018-09-14 DIAGNOSIS — Z5112 Encounter for antineoplastic immunotherapy: Secondary | ICD-10-CM | POA: Diagnosis not present

## 2018-09-17 DIAGNOSIS — R4182 Altered mental status, unspecified: Secondary | ICD-10-CM | POA: Diagnosis not present

## 2018-09-17 DIAGNOSIS — R410 Disorientation, unspecified: Secondary | ICD-10-CM | POA: Diagnosis not present

## 2018-09-17 DIAGNOSIS — I10 Essential (primary) hypertension: Secondary | ICD-10-CM | POA: Diagnosis not present

## 2018-09-17 DIAGNOSIS — R440 Auditory hallucinations: Secondary | ICD-10-CM | POA: Diagnosis not present

## 2018-09-17 DIAGNOSIS — R404 Transient alteration of awareness: Secondary | ICD-10-CM | POA: Diagnosis not present

## 2018-09-17 DIAGNOSIS — R441 Visual hallucinations: Secondary | ICD-10-CM | POA: Diagnosis not present

## 2018-09-17 DIAGNOSIS — R0902 Hypoxemia: Secondary | ICD-10-CM | POA: Diagnosis not present

## 2018-09-18 DIAGNOSIS — C3491 Malignant neoplasm of unspecified part of right bronchus or lung: Secondary | ICD-10-CM | POA: Diagnosis not present

## 2018-09-18 DIAGNOSIS — F909 Attention-deficit hyperactivity disorder, unspecified type: Secondary | ICD-10-CM | POA: Diagnosis not present

## 2018-09-18 DIAGNOSIS — E860 Dehydration: Secondary | ICD-10-CM | POA: Diagnosis not present

## 2018-09-18 DIAGNOSIS — R410 Disorientation, unspecified: Secondary | ICD-10-CM | POA: Diagnosis not present

## 2018-09-18 DIAGNOSIS — F302 Manic episode, severe with psychotic symptoms: Secondary | ICD-10-CM | POA: Diagnosis not present

## 2018-09-18 DIAGNOSIS — R4182 Altered mental status, unspecified: Secondary | ICD-10-CM | POA: Diagnosis not present

## 2018-09-18 DIAGNOSIS — I10 Essential (primary) hypertension: Secondary | ICD-10-CM | POA: Diagnosis not present

## 2018-09-19 DIAGNOSIS — C3491 Malignant neoplasm of unspecified part of right bronchus or lung: Secondary | ICD-10-CM | POA: Diagnosis not present

## 2018-09-19 DIAGNOSIS — I10 Essential (primary) hypertension: Secondary | ICD-10-CM | POA: Diagnosis not present

## 2018-09-19 DIAGNOSIS — F909 Attention-deficit hyperactivity disorder, unspecified type: Secondary | ICD-10-CM | POA: Diagnosis not present

## 2018-09-19 DIAGNOSIS — E860 Dehydration: Secondary | ICD-10-CM | POA: Diagnosis not present

## 2018-09-19 DIAGNOSIS — R4182 Altered mental status, unspecified: Secondary | ICD-10-CM | POA: Diagnosis not present

## 2018-09-19 DIAGNOSIS — R410 Disorientation, unspecified: Secondary | ICD-10-CM | POA: Diagnosis not present

## 2018-09-19 DIAGNOSIS — F309 Manic episode, unspecified: Secondary | ICD-10-CM | POA: Diagnosis not present

## 2018-09-19 DIAGNOSIS — F302 Manic episode, severe with psychotic symptoms: Secondary | ICD-10-CM | POA: Diagnosis not present

## 2018-09-20 DIAGNOSIS — R4182 Altered mental status, unspecified: Secondary | ICD-10-CM | POA: Diagnosis not present

## 2018-09-20 DIAGNOSIS — F909 Attention-deficit hyperactivity disorder, unspecified type: Secondary | ICD-10-CM | POA: Diagnosis not present

## 2018-09-20 DIAGNOSIS — F309 Manic episode, unspecified: Secondary | ICD-10-CM | POA: Diagnosis not present

## 2018-09-20 DIAGNOSIS — C3491 Malignant neoplasm of unspecified part of right bronchus or lung: Secondary | ICD-10-CM | POA: Diagnosis not present

## 2018-09-20 DIAGNOSIS — C3490 Malignant neoplasm of unspecified part of unspecified bronchus or lung: Secondary | ICD-10-CM | POA: Diagnosis not present

## 2018-09-21 DIAGNOSIS — C3491 Malignant neoplasm of unspecified part of right bronchus or lung: Secondary | ICD-10-CM | POA: Diagnosis not present

## 2018-09-21 DIAGNOSIS — F309 Manic episode, unspecified: Secondary | ICD-10-CM | POA: Diagnosis not present

## 2018-09-21 DIAGNOSIS — F909 Attention-deficit hyperactivity disorder, unspecified type: Secondary | ICD-10-CM | POA: Diagnosis not present

## 2018-09-21 DIAGNOSIS — R4182 Altered mental status, unspecified: Secondary | ICD-10-CM | POA: Diagnosis not present

## 2018-09-29 DIAGNOSIS — F419 Anxiety disorder, unspecified: Secondary | ICD-10-CM | POA: Diagnosis not present

## 2018-10-10 DIAGNOSIS — Z5112 Encounter for antineoplastic immunotherapy: Secondary | ICD-10-CM | POA: Diagnosis not present

## 2018-10-10 DIAGNOSIS — C7931 Secondary malignant neoplasm of brain: Secondary | ICD-10-CM | POA: Diagnosis not present

## 2018-10-10 DIAGNOSIS — C3431 Malignant neoplasm of lower lobe, right bronchus or lung: Secondary | ICD-10-CM | POA: Diagnosis not present

## 2018-10-10 DIAGNOSIS — R3915 Urgency of urination: Secondary | ICD-10-CM | POA: Diagnosis not present

## 2018-10-10 DIAGNOSIS — R0600 Dyspnea, unspecified: Secondary | ICD-10-CM | POA: Diagnosis not present

## 2018-10-10 DIAGNOSIS — R300 Dysuria: Secondary | ICD-10-CM | POA: Diagnosis not present

## 2018-10-10 DIAGNOSIS — Z87891 Personal history of nicotine dependence: Secondary | ICD-10-CM | POA: Diagnosis not present

## 2018-10-10 DIAGNOSIS — F909 Attention-deficit hyperactivity disorder, unspecified type: Secondary | ICD-10-CM | POA: Diagnosis not present

## 2018-10-10 DIAGNOSIS — I10 Essential (primary) hypertension: Secondary | ICD-10-CM | POA: Diagnosis not present

## 2018-10-10 DIAGNOSIS — R350 Frequency of micturition: Secondary | ICD-10-CM | POA: Diagnosis not present

## 2018-10-10 DIAGNOSIS — C771 Secondary and unspecified malignant neoplasm of intrathoracic lymph nodes: Secondary | ICD-10-CM | POA: Diagnosis not present

## 2018-10-10 DIAGNOSIS — R918 Other nonspecific abnormal finding of lung field: Secondary | ICD-10-CM | POA: Diagnosis not present

## 2018-10-10 DIAGNOSIS — C3491 Malignant neoplasm of unspecified part of right bronchus or lung: Secondary | ICD-10-CM | POA: Diagnosis not present

## 2018-10-19 DIAGNOSIS — Z79899 Other long term (current) drug therapy: Secondary | ICD-10-CM | POA: Diagnosis not present

## 2018-10-19 DIAGNOSIS — F902 Attention-deficit hyperactivity disorder, combined type: Secondary | ICD-10-CM | POA: Diagnosis not present

## 2018-11-02 DIAGNOSIS — Z9119 Patient's noncompliance with other medical treatment and regimen: Secondary | ICD-10-CM | POA: Diagnosis not present

## 2018-11-02 DIAGNOSIS — Z5112 Encounter for antineoplastic immunotherapy: Secondary | ICD-10-CM | POA: Diagnosis not present

## 2018-11-02 DIAGNOSIS — C3491 Malignant neoplasm of unspecified part of right bronchus or lung: Secondary | ICD-10-CM | POA: Diagnosis not present

## 2018-11-02 DIAGNOSIS — C7931 Secondary malignant neoplasm of brain: Secondary | ICD-10-CM | POA: Diagnosis not present

## 2018-11-02 DIAGNOSIS — I10 Essential (primary) hypertension: Secondary | ICD-10-CM | POA: Diagnosis not present

## 2018-11-02 DIAGNOSIS — R5383 Other fatigue: Secondary | ICD-10-CM | POA: Diagnosis not present

## 2018-11-02 DIAGNOSIS — F909 Attention-deficit hyperactivity disorder, unspecified type: Secondary | ICD-10-CM | POA: Diagnosis not present

## 2018-11-02 DIAGNOSIS — Z87891 Personal history of nicotine dependence: Secondary | ICD-10-CM | POA: Diagnosis not present

## 2018-11-02 DIAGNOSIS — C3431 Malignant neoplasm of lower lobe, right bronchus or lung: Secondary | ICD-10-CM | POA: Diagnosis not present

## 2018-11-02 DIAGNOSIS — E876 Hypokalemia: Secondary | ICD-10-CM | POA: Diagnosis not present

## 2018-11-02 DIAGNOSIS — Z923 Personal history of irradiation: Secondary | ICD-10-CM | POA: Diagnosis not present

## 2018-11-17 DIAGNOSIS — F902 Attention-deficit hyperactivity disorder, combined type: Secondary | ICD-10-CM | POA: Diagnosis not present

## 2018-11-18 DIAGNOSIS — R451 Restlessness and agitation: Secondary | ICD-10-CM | POA: Diagnosis not present

## 2018-11-18 DIAGNOSIS — F309 Manic episode, unspecified: Secondary | ICD-10-CM | POA: Diagnosis not present

## 2018-11-18 DIAGNOSIS — F909 Attention-deficit hyperactivity disorder, unspecified type: Secondary | ICD-10-CM | POA: Diagnosis not present

## 2018-11-18 DIAGNOSIS — Z515 Encounter for palliative care: Secondary | ICD-10-CM | POA: Diagnosis not present

## 2018-11-21 DIAGNOSIS — Z87891 Personal history of nicotine dependence: Secondary | ICD-10-CM | POA: Diagnosis not present

## 2018-11-21 DIAGNOSIS — I10 Essential (primary) hypertension: Secondary | ICD-10-CM | POA: Diagnosis not present

## 2018-11-21 DIAGNOSIS — K529 Noninfective gastroenteritis and colitis, unspecified: Secondary | ICD-10-CM | POA: Diagnosis not present

## 2018-11-21 DIAGNOSIS — F909 Attention-deficit hyperactivity disorder, unspecified type: Secondary | ICD-10-CM | POA: Diagnosis not present

## 2018-11-21 DIAGNOSIS — E876 Hypokalemia: Secondary | ICD-10-CM | POA: Diagnosis not present

## 2018-11-21 DIAGNOSIS — R5383 Other fatigue: Secondary | ICD-10-CM | POA: Diagnosis not present

## 2018-11-21 DIAGNOSIS — Z79899 Other long term (current) drug therapy: Secondary | ICD-10-CM | POA: Diagnosis not present

## 2018-11-21 DIAGNOSIS — C7931 Secondary malignant neoplasm of brain: Secondary | ICD-10-CM | POA: Diagnosis not present

## 2018-11-21 DIAGNOSIS — C3491 Malignant neoplasm of unspecified part of right bronchus or lung: Secondary | ICD-10-CM | POA: Diagnosis not present

## 2018-11-21 DIAGNOSIS — Z5112 Encounter for antineoplastic immunotherapy: Secondary | ICD-10-CM | POA: Diagnosis not present

## 2018-11-21 DIAGNOSIS — R630 Anorexia: Secondary | ICD-10-CM | POA: Diagnosis not present

## 2018-12-12 DIAGNOSIS — C7931 Secondary malignant neoplasm of brain: Secondary | ICD-10-CM | POA: Diagnosis not present

## 2018-12-12 DIAGNOSIS — C3491 Malignant neoplasm of unspecified part of right bronchus or lung: Secondary | ICD-10-CM | POA: Diagnosis not present

## 2018-12-12 DIAGNOSIS — R0609 Other forms of dyspnea: Secondary | ICD-10-CM | POA: Diagnosis not present

## 2018-12-12 DIAGNOSIS — R590 Localized enlarged lymph nodes: Secondary | ICD-10-CM | POA: Diagnosis not present

## 2018-12-12 DIAGNOSIS — Z87891 Personal history of nicotine dependence: Secondary | ICD-10-CM | POA: Diagnosis not present

## 2018-12-12 DIAGNOSIS — Z5111 Encounter for antineoplastic chemotherapy: Secondary | ICD-10-CM | POA: Diagnosis not present

## 2018-12-12 DIAGNOSIS — I10 Essential (primary) hypertension: Secondary | ICD-10-CM | POA: Diagnosis not present

## 2018-12-12 DIAGNOSIS — F909 Attention-deficit hyperactivity disorder, unspecified type: Secondary | ICD-10-CM | POA: Diagnosis not present

## 2018-12-15 DIAGNOSIS — R5383 Other fatigue: Secondary | ICD-10-CM | POA: Diagnosis not present

## 2018-12-16 DIAGNOSIS — J439 Emphysema, unspecified: Secondary | ICD-10-CM | POA: Diagnosis not present

## 2018-12-16 DIAGNOSIS — C7931 Secondary malignant neoplasm of brain: Secondary | ICD-10-CM | POA: Diagnosis not present

## 2018-12-16 DIAGNOSIS — B1920 Unspecified viral hepatitis C without hepatic coma: Secondary | ICD-10-CM | POA: Diagnosis not present

## 2018-12-16 DIAGNOSIS — Z5112 Encounter for antineoplastic immunotherapy: Secondary | ICD-10-CM | POA: Diagnosis not present

## 2018-12-16 DIAGNOSIS — I10 Essential (primary) hypertension: Secondary | ICD-10-CM | POA: Diagnosis not present

## 2018-12-16 DIAGNOSIS — Z79899 Other long term (current) drug therapy: Secondary | ICD-10-CM | POA: Diagnosis not present

## 2018-12-16 DIAGNOSIS — Z87891 Personal history of nicotine dependence: Secondary | ICD-10-CM | POA: Diagnosis not present

## 2018-12-16 DIAGNOSIS — C3431 Malignant neoplasm of lower lobe, right bronchus or lung: Secondary | ICD-10-CM | POA: Diagnosis not present

## 2018-12-16 DIAGNOSIS — C3491 Malignant neoplasm of unspecified part of right bronchus or lung: Secondary | ICD-10-CM | POA: Diagnosis not present

## 2018-12-16 DIAGNOSIS — Z515 Encounter for palliative care: Secondary | ICD-10-CM | POA: Diagnosis not present

## 2018-12-16 DIAGNOSIS — R42 Dizziness and giddiness: Secondary | ICD-10-CM | POA: Diagnosis not present

## 2018-12-16 DIAGNOSIS — R531 Weakness: Secondary | ICD-10-CM | POA: Diagnosis not present

## 2018-12-16 DIAGNOSIS — F909 Attention-deficit hyperactivity disorder, unspecified type: Secondary | ICD-10-CM | POA: Diagnosis not present

## 2018-12-16 DIAGNOSIS — Z91041 Radiographic dye allergy status: Secondary | ICD-10-CM | POA: Diagnosis not present

## 2018-12-16 DIAGNOSIS — R451 Restlessness and agitation: Secondary | ICD-10-CM | POA: Diagnosis not present

## 2018-12-16 DIAGNOSIS — J8410 Pulmonary fibrosis, unspecified: Secondary | ICD-10-CM | POA: Diagnosis not present

## 2019-01-09 DIAGNOSIS — C3431 Malignant neoplasm of lower lobe, right bronchus or lung: Secondary | ICD-10-CM | POA: Diagnosis not present

## 2019-01-09 DIAGNOSIS — R590 Localized enlarged lymph nodes: Secondary | ICD-10-CM | POA: Diagnosis not present

## 2019-01-09 DIAGNOSIS — Z5112 Encounter for antineoplastic immunotherapy: Secondary | ICD-10-CM | POA: Diagnosis not present

## 2019-01-09 DIAGNOSIS — Z5111 Encounter for antineoplastic chemotherapy: Secondary | ICD-10-CM | POA: Diagnosis not present

## 2019-01-09 DIAGNOSIS — Z923 Personal history of irradiation: Secondary | ICD-10-CM | POA: Diagnosis not present

## 2019-01-09 DIAGNOSIS — I10 Essential (primary) hypertension: Secondary | ICD-10-CM | POA: Diagnosis not present

## 2019-01-09 DIAGNOSIS — Z87891 Personal history of nicotine dependence: Secondary | ICD-10-CM | POA: Diagnosis not present

## 2019-01-09 DIAGNOSIS — F909 Attention-deficit hyperactivity disorder, unspecified type: Secondary | ICD-10-CM | POA: Diagnosis not present

## 2019-01-09 DIAGNOSIS — Z79899 Other long term (current) drug therapy: Secondary | ICD-10-CM | POA: Diagnosis not present

## 2019-01-09 DIAGNOSIS — C7931 Secondary malignant neoplasm of brain: Secondary | ICD-10-CM | POA: Diagnosis not present

## 2019-01-13 DIAGNOSIS — F309 Manic episode, unspecified: Secondary | ICD-10-CM | POA: Diagnosis not present

## 2019-01-30 DIAGNOSIS — F064 Anxiety disorder due to known physiological condition: Secondary | ICD-10-CM | POA: Diagnosis not present

## 2019-02-09 DIAGNOSIS — C7931 Secondary malignant neoplasm of brain: Secondary | ICD-10-CM | POA: Diagnosis not present

## 2019-02-09 DIAGNOSIS — C801 Malignant (primary) neoplasm, unspecified: Secondary | ICD-10-CM | POA: Diagnosis not present

## 2019-02-13 DIAGNOSIS — Z79899 Other long term (current) drug therapy: Secondary | ICD-10-CM | POA: Diagnosis not present

## 2019-02-13 DIAGNOSIS — Z5112 Encounter for antineoplastic immunotherapy: Secondary | ICD-10-CM | POA: Diagnosis not present

## 2019-02-13 DIAGNOSIS — C7931 Secondary malignant neoplasm of brain: Secondary | ICD-10-CM | POA: Diagnosis not present

## 2019-02-13 DIAGNOSIS — Z87891 Personal history of nicotine dependence: Secondary | ICD-10-CM | POA: Diagnosis not present

## 2019-02-13 DIAGNOSIS — C3491 Malignant neoplasm of unspecified part of right bronchus or lung: Secondary | ICD-10-CM | POA: Diagnosis not present

## 2019-02-13 DIAGNOSIS — I10 Essential (primary) hypertension: Secondary | ICD-10-CM | POA: Diagnosis not present

## 2019-02-13 DIAGNOSIS — J8410 Pulmonary fibrosis, unspecified: Secondary | ICD-10-CM | POA: Diagnosis not present

## 2019-02-16 DIAGNOSIS — C7931 Secondary malignant neoplasm of brain: Secondary | ICD-10-CM | POA: Diagnosis not present

## 2019-02-28 DIAGNOSIS — Z515 Encounter for palliative care: Secondary | ICD-10-CM | POA: Diagnosis not present

## 2019-02-28 DIAGNOSIS — T451X5A Adverse effect of antineoplastic and immunosuppressive drugs, initial encounter: Secondary | ICD-10-CM | POA: Diagnosis not present

## 2019-02-28 DIAGNOSIS — R110 Nausea: Secondary | ICD-10-CM | POA: Diagnosis not present

## 2019-02-28 DIAGNOSIS — F064 Anxiety disorder due to known physiological condition: Secondary | ICD-10-CM | POA: Diagnosis not present

## 2019-03-13 DIAGNOSIS — Z87891 Personal history of nicotine dependence: Secondary | ICD-10-CM | POA: Diagnosis not present

## 2019-03-13 DIAGNOSIS — C3431 Malignant neoplasm of lower lobe, right bronchus or lung: Secondary | ICD-10-CM | POA: Diagnosis not present

## 2019-03-13 DIAGNOSIS — C3491 Malignant neoplasm of unspecified part of right bronchus or lung: Secondary | ICD-10-CM | POA: Diagnosis not present

## 2019-03-13 DIAGNOSIS — I10 Essential (primary) hypertension: Secondary | ICD-10-CM | POA: Diagnosis not present

## 2019-03-13 DIAGNOSIS — R0609 Other forms of dyspnea: Secondary | ICD-10-CM | POA: Diagnosis not present

## 2019-03-13 DIAGNOSIS — Z79899 Other long term (current) drug therapy: Secondary | ICD-10-CM | POA: Diagnosis not present

## 2019-03-13 DIAGNOSIS — Z8659 Personal history of other mental and behavioral disorders: Secondary | ICD-10-CM | POA: Diagnosis not present

## 2019-03-13 DIAGNOSIS — C7931 Secondary malignant neoplasm of brain: Secondary | ICD-10-CM | POA: Diagnosis not present

## 2019-03-13 DIAGNOSIS — R5383 Other fatigue: Secondary | ICD-10-CM | POA: Diagnosis not present

## 2019-03-13 DIAGNOSIS — Z5112 Encounter for antineoplastic immunotherapy: Secondary | ICD-10-CM | POA: Diagnosis not present

## 2019-03-13 DIAGNOSIS — Z923 Personal history of irradiation: Secondary | ICD-10-CM | POA: Diagnosis not present

## 2019-04-03 DIAGNOSIS — I10 Essential (primary) hypertension: Secondary | ICD-10-CM | POA: Diagnosis not present

## 2019-04-03 DIAGNOSIS — Z87891 Personal history of nicotine dependence: Secondary | ICD-10-CM | POA: Diagnosis not present

## 2019-04-03 DIAGNOSIS — F909 Attention-deficit hyperactivity disorder, unspecified type: Secondary | ICD-10-CM | POA: Diagnosis not present

## 2019-04-03 DIAGNOSIS — C3491 Malignant neoplasm of unspecified part of right bronchus or lung: Secondary | ICD-10-CM | POA: Diagnosis not present

## 2019-04-03 DIAGNOSIS — R918 Other nonspecific abnormal finding of lung field: Secondary | ICD-10-CM | POA: Diagnosis not present

## 2019-04-03 DIAGNOSIS — Z5112 Encounter for antineoplastic immunotherapy: Secondary | ICD-10-CM | POA: Diagnosis not present

## 2019-04-03 DIAGNOSIS — C3431 Malignant neoplasm of lower lobe, right bronchus or lung: Secondary | ICD-10-CM | POA: Diagnosis not present

## 2019-04-03 DIAGNOSIS — Z923 Personal history of irradiation: Secondary | ICD-10-CM | POA: Diagnosis not present

## 2019-04-03 DIAGNOSIS — C7931 Secondary malignant neoplasm of brain: Secondary | ICD-10-CM | POA: Diagnosis not present

## 2019-04-03 DIAGNOSIS — Z79899 Other long term (current) drug therapy: Secondary | ICD-10-CM | POA: Diagnosis not present

## 2019-04-03 DIAGNOSIS — C771 Secondary and unspecified malignant neoplasm of intrathoracic lymph nodes: Secondary | ICD-10-CM | POA: Diagnosis not present

## 2019-04-07 DIAGNOSIS — F064 Anxiety disorder due to known physiological condition: Secondary | ICD-10-CM | POA: Diagnosis not present

## 2019-04-07 DIAGNOSIS — R110 Nausea: Secondary | ICD-10-CM | POA: Diagnosis not present

## 2019-04-07 DIAGNOSIS — Z515 Encounter for palliative care: Secondary | ICD-10-CM | POA: Diagnosis not present

## 2019-04-07 DIAGNOSIS — T451X5A Adverse effect of antineoplastic and immunosuppressive drugs, initial encounter: Secondary | ICD-10-CM | POA: Diagnosis not present

## 2019-04-11 DIAGNOSIS — M25562 Pain in left knee: Secondary | ICD-10-CM | POA: Diagnosis not present

## 2019-04-11 DIAGNOSIS — M7989 Other specified soft tissue disorders: Secondary | ICD-10-CM | POA: Diagnosis not present

## 2019-04-11 DIAGNOSIS — M79605 Pain in left leg: Secondary | ICD-10-CM | POA: Diagnosis not present

## 2019-04-11 DIAGNOSIS — C3490 Malignant neoplasm of unspecified part of unspecified bronchus or lung: Secondary | ICD-10-CM | POA: Diagnosis not present

## 2019-04-11 DIAGNOSIS — R2241 Localized swelling, mass and lump, right lower limb: Secondary | ICD-10-CM | POA: Diagnosis not present

## 2019-04-11 DIAGNOSIS — Z880 Allergy status to penicillin: Secondary | ICD-10-CM | POA: Diagnosis not present

## 2019-04-11 DIAGNOSIS — M25561 Pain in right knee: Secondary | ICD-10-CM | POA: Diagnosis not present

## 2019-04-11 DIAGNOSIS — M79662 Pain in left lower leg: Secondary | ICD-10-CM | POA: Diagnosis not present

## 2019-04-24 DIAGNOSIS — Z5112 Encounter for antineoplastic immunotherapy: Secondary | ICD-10-CM | POA: Diagnosis not present

## 2019-04-24 DIAGNOSIS — Z87891 Personal history of nicotine dependence: Secondary | ICD-10-CM | POA: Diagnosis not present

## 2019-04-24 DIAGNOSIS — C3491 Malignant neoplasm of unspecified part of right bronchus or lung: Secondary | ICD-10-CM | POA: Diagnosis not present

## 2019-04-24 DIAGNOSIS — F909 Attention-deficit hyperactivity disorder, unspecified type: Secondary | ICD-10-CM | POA: Diagnosis not present

## 2019-04-24 DIAGNOSIS — C7931 Secondary malignant neoplasm of brain: Secondary | ICD-10-CM | POA: Diagnosis not present

## 2019-04-24 DIAGNOSIS — Z923 Personal history of irradiation: Secondary | ICD-10-CM | POA: Diagnosis not present

## 2019-04-24 DIAGNOSIS — C3431 Malignant neoplasm of lower lobe, right bronchus or lung: Secondary | ICD-10-CM | POA: Diagnosis not present

## 2019-04-24 DIAGNOSIS — I10 Essential (primary) hypertension: Secondary | ICD-10-CM | POA: Diagnosis not present

## 2019-05-04 DIAGNOSIS — F064 Anxiety disorder due to known physiological condition: Secondary | ICD-10-CM | POA: Diagnosis not present

## 2019-05-04 DIAGNOSIS — Z515 Encounter for palliative care: Secondary | ICD-10-CM | POA: Diagnosis not present

## 2019-05-15 DIAGNOSIS — Z1159 Encounter for screening for other viral diseases: Secondary | ICD-10-CM | POA: Diagnosis not present

## 2019-05-15 DIAGNOSIS — Z87898 Personal history of other specified conditions: Secondary | ICD-10-CM | POA: Diagnosis not present

## 2019-05-19 DIAGNOSIS — F909 Attention-deficit hyperactivity disorder, unspecified type: Secondary | ICD-10-CM | POA: Diagnosis not present

## 2019-05-19 DIAGNOSIS — Z79899 Other long term (current) drug therapy: Secondary | ICD-10-CM | POA: Diagnosis not present

## 2019-05-19 DIAGNOSIS — I10 Essential (primary) hypertension: Secondary | ICD-10-CM | POA: Diagnosis not present

## 2019-05-19 DIAGNOSIS — C7931 Secondary malignant neoplasm of brain: Secondary | ICD-10-CM | POA: Diagnosis not present

## 2019-05-19 DIAGNOSIS — Z87891 Personal history of nicotine dependence: Secondary | ICD-10-CM | POA: Diagnosis not present

## 2019-05-19 DIAGNOSIS — Z5112 Encounter for antineoplastic immunotherapy: Secondary | ICD-10-CM | POA: Diagnosis not present

## 2019-05-19 DIAGNOSIS — C3491 Malignant neoplasm of unspecified part of right bronchus or lung: Secondary | ICD-10-CM | POA: Diagnosis not present

## 2019-05-30 DIAGNOSIS — C3491 Malignant neoplasm of unspecified part of right bronchus or lung: Secondary | ICD-10-CM | POA: Diagnosis not present

## 2019-05-30 DIAGNOSIS — C7931 Secondary malignant neoplasm of brain: Secondary | ICD-10-CM | POA: Diagnosis not present

## 2019-05-30 DIAGNOSIS — C801 Malignant (primary) neoplasm, unspecified: Secondary | ICD-10-CM | POA: Diagnosis not present

## 2019-06-12 DIAGNOSIS — Z79899 Other long term (current) drug therapy: Secondary | ICD-10-CM | POA: Diagnosis not present

## 2019-06-12 DIAGNOSIS — K449 Diaphragmatic hernia without obstruction or gangrene: Secondary | ICD-10-CM | POA: Diagnosis not present

## 2019-06-12 DIAGNOSIS — Z87891 Personal history of nicotine dependence: Secondary | ICD-10-CM | POA: Diagnosis not present

## 2019-06-12 DIAGNOSIS — Z923 Personal history of irradiation: Secondary | ICD-10-CM | POA: Diagnosis not present

## 2019-06-12 DIAGNOSIS — C3491 Malignant neoplasm of unspecified part of right bronchus or lung: Secondary | ICD-10-CM | POA: Diagnosis not present

## 2019-06-12 DIAGNOSIS — R918 Other nonspecific abnormal finding of lung field: Secondary | ICD-10-CM | POA: Diagnosis not present

## 2019-06-12 DIAGNOSIS — I10 Essential (primary) hypertension: Secondary | ICD-10-CM | POA: Diagnosis not present

## 2019-06-12 DIAGNOSIS — J479 Bronchiectasis, uncomplicated: Secondary | ICD-10-CM | POA: Diagnosis not present

## 2019-06-12 DIAGNOSIS — R0902 Hypoxemia: Secondary | ICD-10-CM | POA: Diagnosis not present

## 2019-06-12 DIAGNOSIS — F909 Attention-deficit hyperactivity disorder, unspecified type: Secondary | ICD-10-CM | POA: Diagnosis not present

## 2019-06-12 DIAGNOSIS — C7931 Secondary malignant neoplasm of brain: Secondary | ICD-10-CM | POA: Diagnosis not present

## 2019-06-12 DIAGNOSIS — R0600 Dyspnea, unspecified: Secondary | ICD-10-CM | POA: Diagnosis not present

## 2019-06-12 DIAGNOSIS — C3431 Malignant neoplasm of lower lobe, right bronchus or lung: Secondary | ICD-10-CM | POA: Diagnosis not present

## 2019-06-22 IMAGING — CR DG CHEST 2V
1 series · 2 of 2 positions shown · non-contrast
Comparison: PA and lateral chest 12/03/2016 04/17/2015.

CLINICAL DATA: Shortness of breath for 4-5 days.

EXAM:
CHEST - 2 VIEW

[Series 1: dg chest 2 view · 0.14mm/px · 2 of 2 slices shown]
[im 1/2]
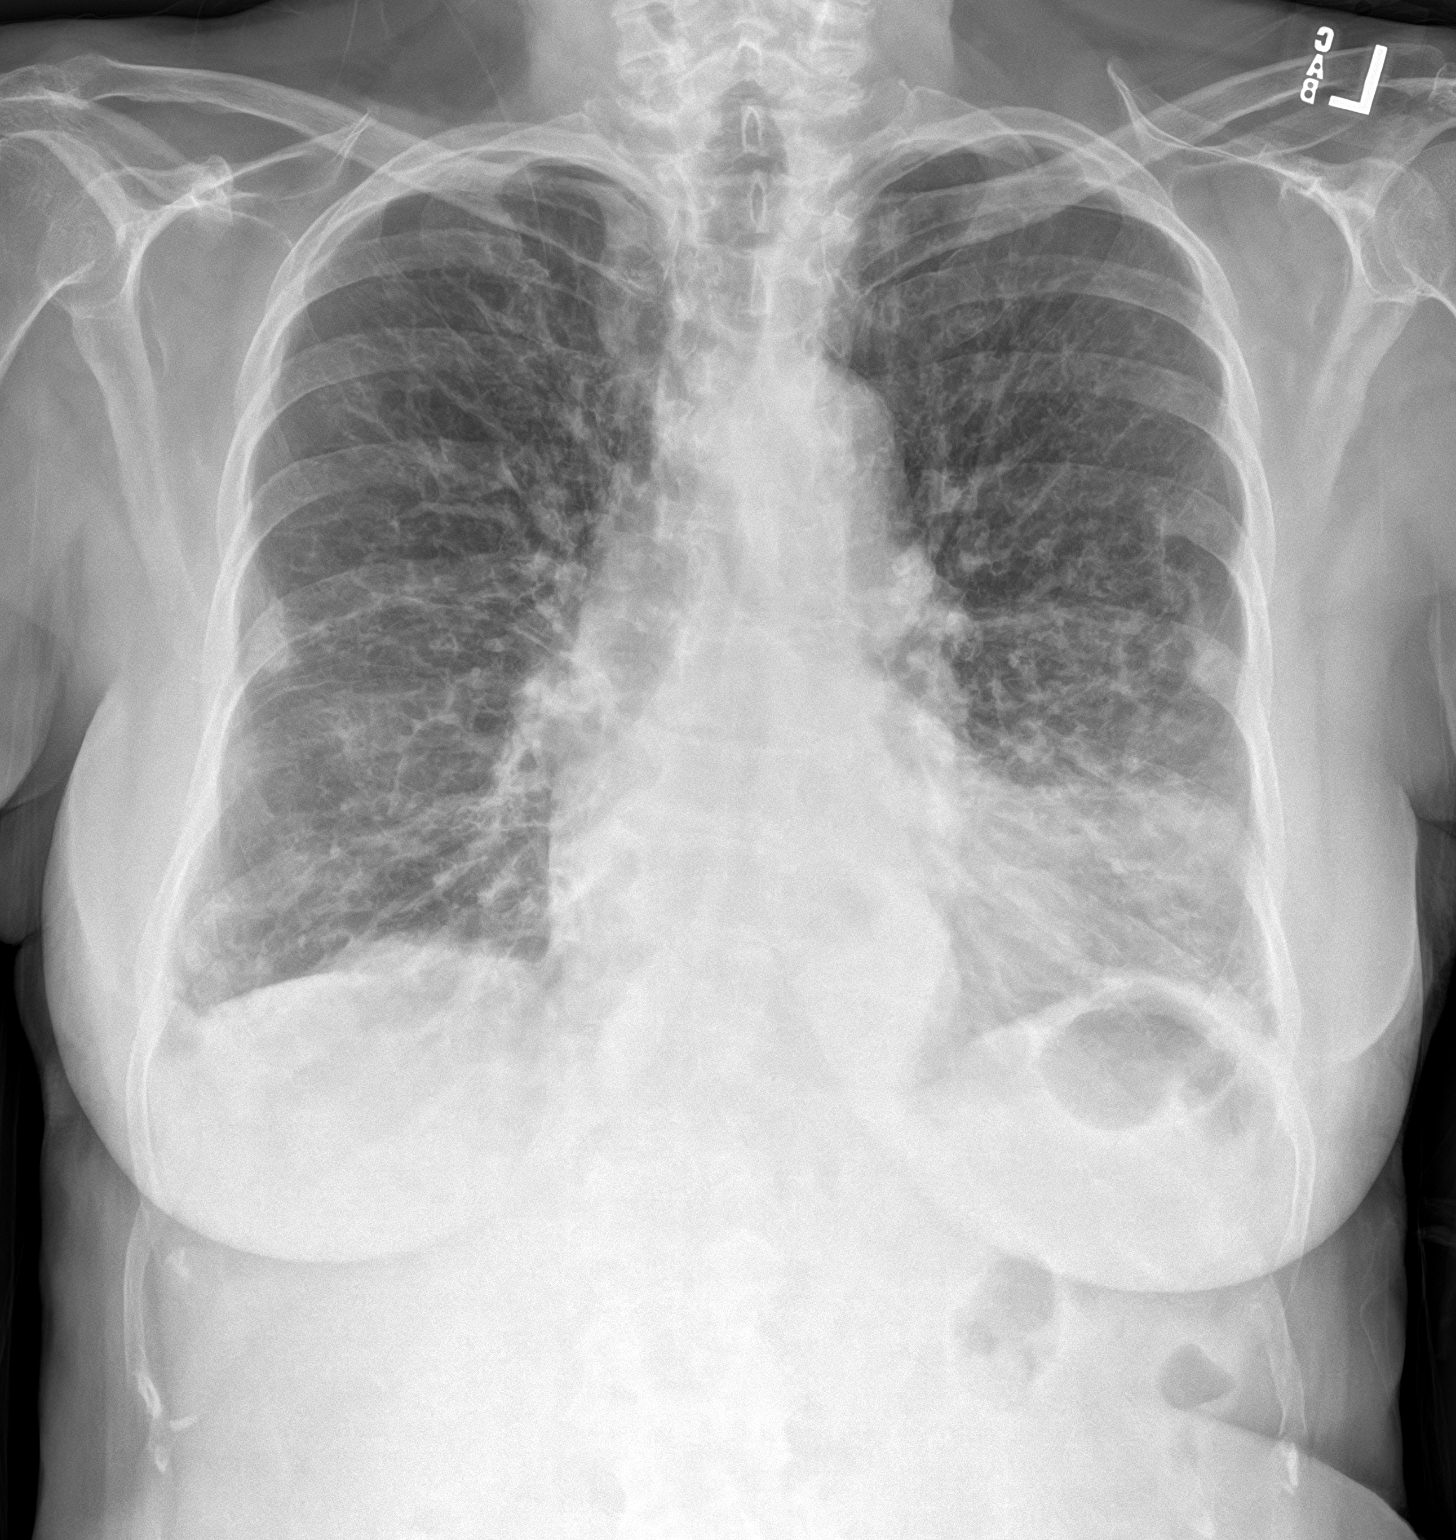
[im 2/2]
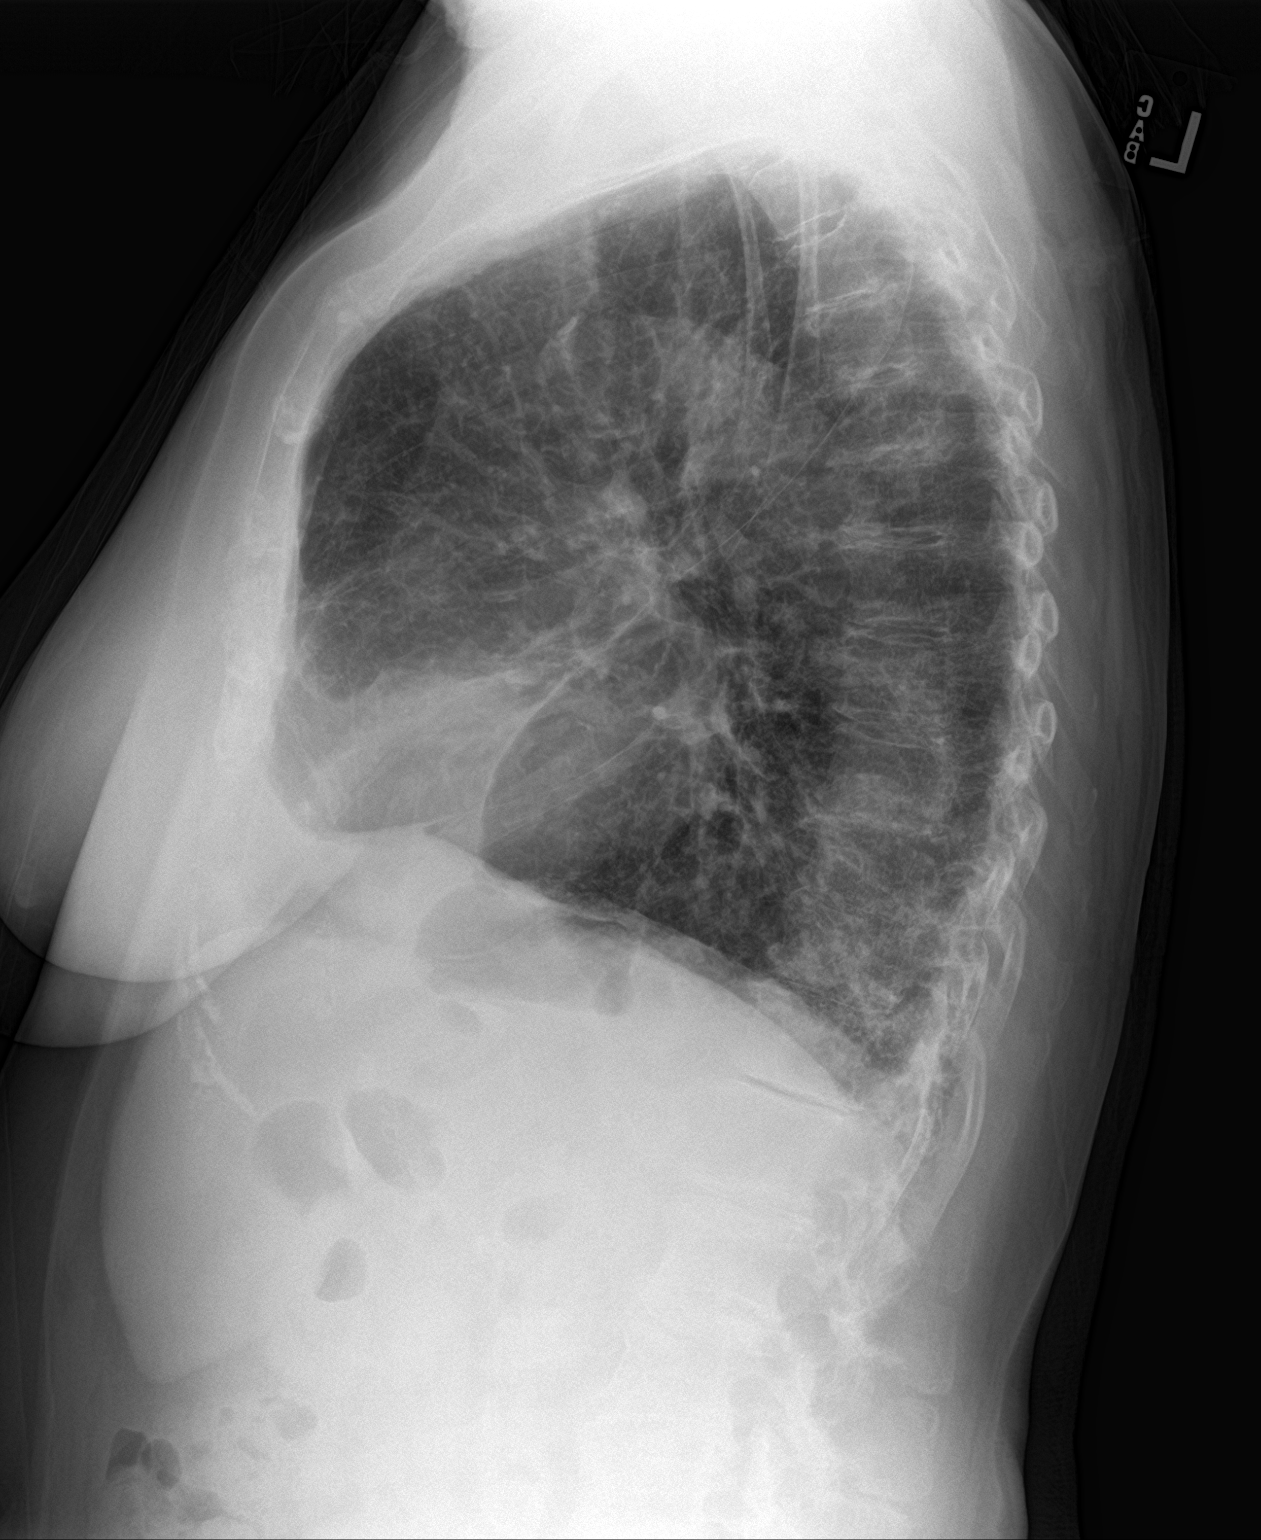

[2 of 2 positions shown; findings below may reference images not displayed]

FINDINGS: Distortion of the pulmonary architecture and pulmonary
hyperexpansion compatible with emphysema are seen. There is focal
airspace disease in the lingula and more patchy airspace disease in
the lower lobes bilaterally. Scattered peribronchial thickening is
identified.
IMPRESSION: Focal left lower lobe airspace disease worrisome for pneumonia. More
patchy bilateral lower lobe airspace disease is also seen. Recommend
followup films to clearing.

COPD.

## 2019-06-23 DIAGNOSIS — F419 Anxiety disorder, unspecified: Secondary | ICD-10-CM | POA: Diagnosis not present

## 2019-06-23 DIAGNOSIS — C7931 Secondary malignant neoplasm of brain: Secondary | ICD-10-CM | POA: Diagnosis not present

## 2019-06-23 DIAGNOSIS — Z87891 Personal history of nicotine dependence: Secondary | ICD-10-CM | POA: Diagnosis not present

## 2019-06-23 DIAGNOSIS — Z923 Personal history of irradiation: Secondary | ICD-10-CM | POA: Diagnosis not present

## 2019-06-23 DIAGNOSIS — C3431 Malignant neoplasm of lower lobe, right bronchus or lung: Secondary | ICD-10-CM | POA: Diagnosis not present

## 2019-06-23 DIAGNOSIS — C3491 Malignant neoplasm of unspecified part of right bronchus or lung: Secondary | ICD-10-CM | POA: Diagnosis not present

## 2019-06-23 DIAGNOSIS — Z8659 Personal history of other mental and behavioral disorders: Secondary | ICD-10-CM | POA: Diagnosis not present

## 2019-06-23 DIAGNOSIS — Z23 Encounter for immunization: Secondary | ICD-10-CM | POA: Diagnosis not present

## 2019-06-23 DIAGNOSIS — R0902 Hypoxemia: Secondary | ICD-10-CM | POA: Diagnosis not present

## 2019-06-23 DIAGNOSIS — B370 Candidal stomatitis: Secondary | ICD-10-CM | POA: Diagnosis not present

## 2019-07-10 IMAGING — CT CT CHEST W/O CM
2 of 4 series · 15 of 36 positions shown, 18 images · non-contrast
Comparison: None.

CLINICAL DATA: Pt c/o worsening SOB and fatigue with productive
cough. F/U CXR done 03/11/2018. She states she has been diagnosed
with pneumonia 3 times in the last 4 months. NKI No hx CA. No hx
thoracic surg. Former smoker, quit 6 years ago.

EXAM:
CT CHEST WITHOUT CONTRAST
TECHNIQUE: Multidetector CT imaging of the chest was performed following the
standard protocol without IV contrast.

[Series 2: chest · axial · 0.61mm/px · z∈[-1305,-1041]mm · 12 of 158 slices shown, 15 images (1 of 2)]
[im 13/158  mediastinal]
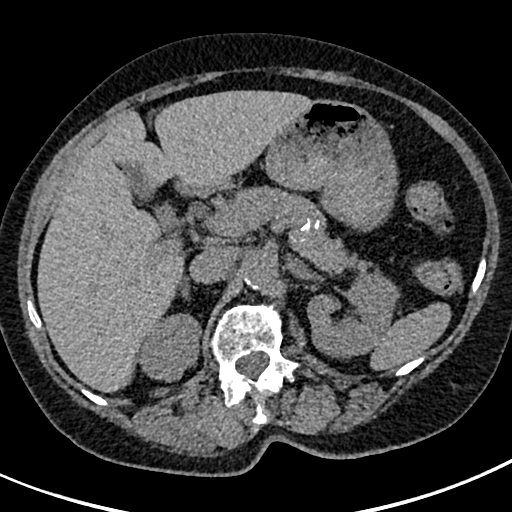
[im 13/158  lung]
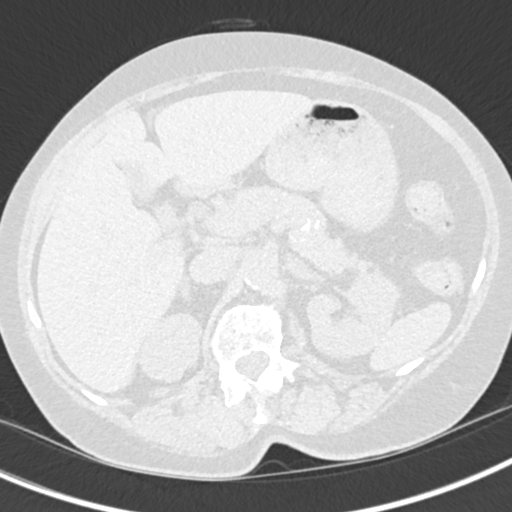
[im 25/158  lung]
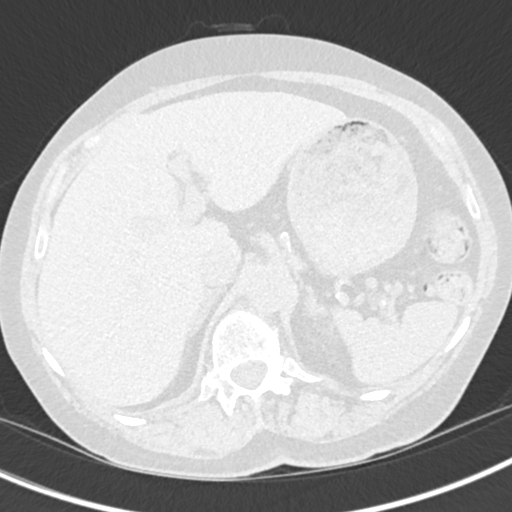
[im 37/158  lung]
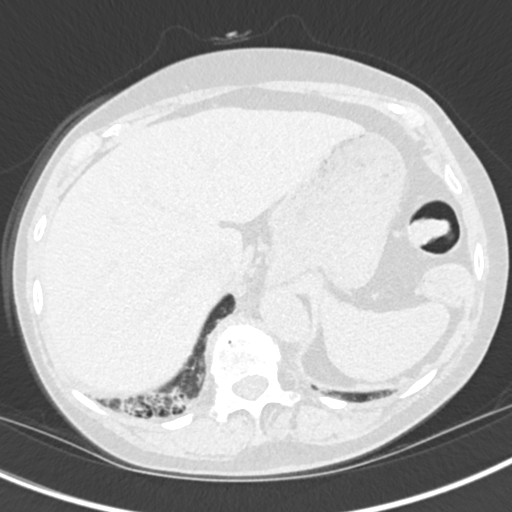
[im 49/158  lung]
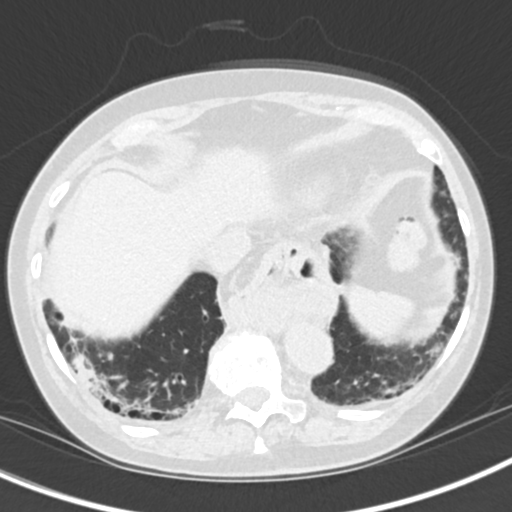
[im 61/158  mediastinal]
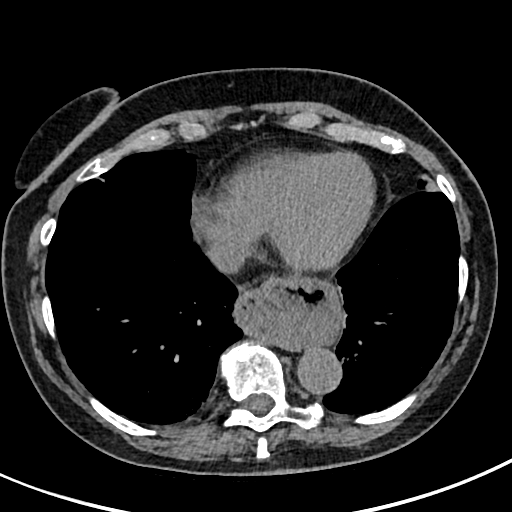
[im 61/158  lung]
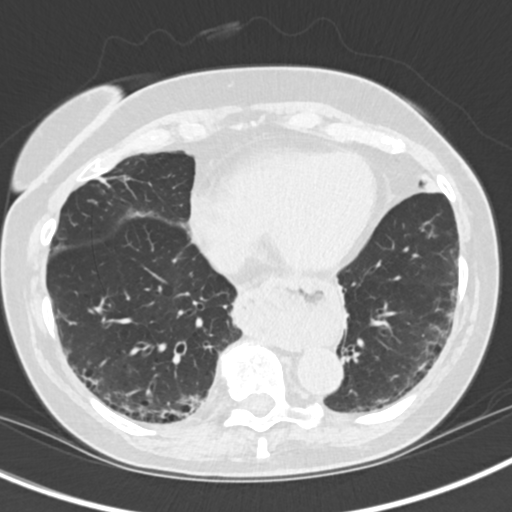
[im 73/158  lung]
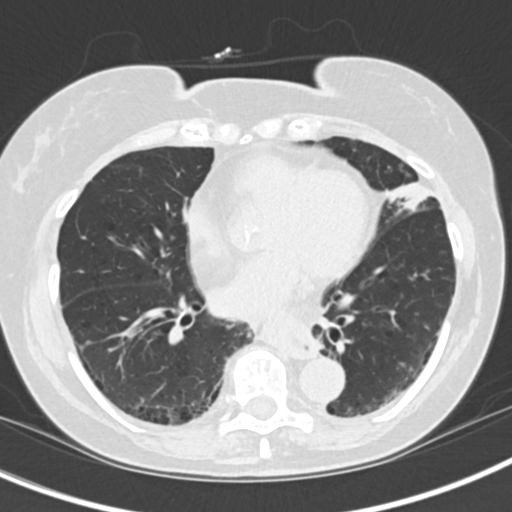
[im 85/158  lung]
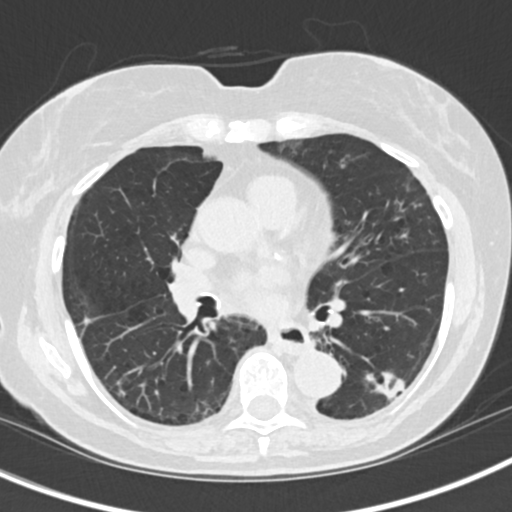
[im 97/158  lung]
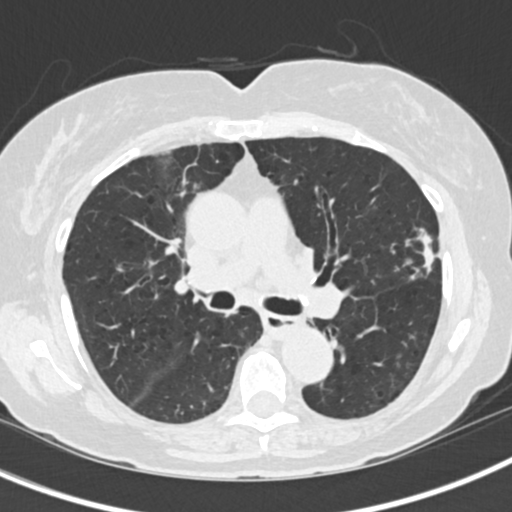
[im 109/158  mediastinal]
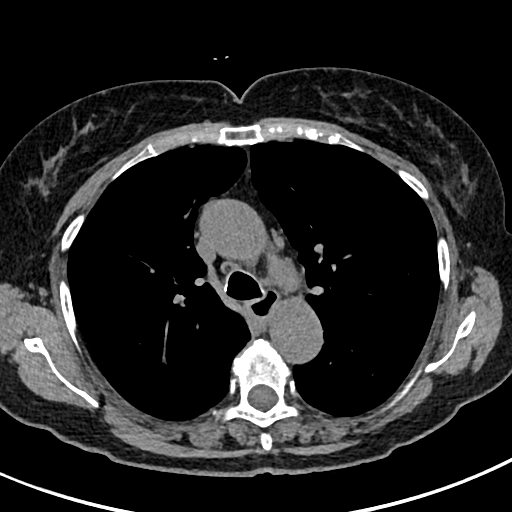
[im 109/158  lung]
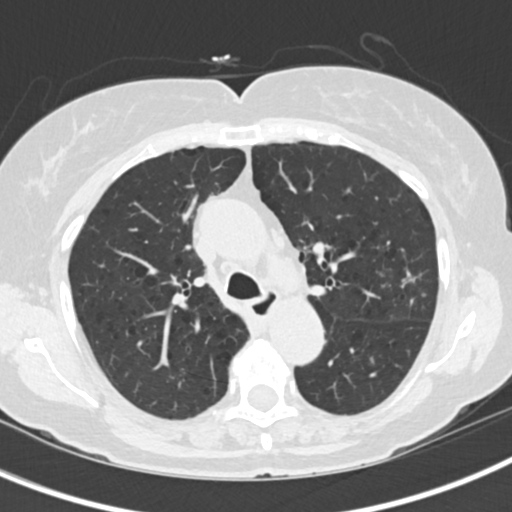
[im 121/158  lung]
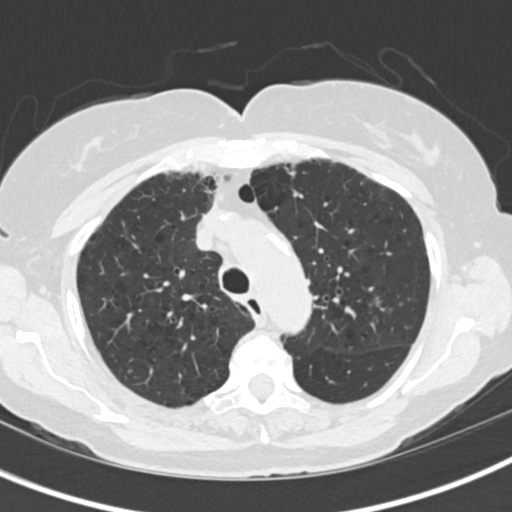
[im 133/158  lung]
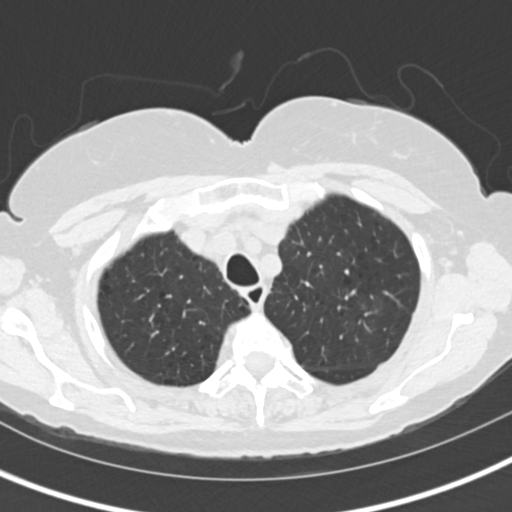
[im 145/158  lung]
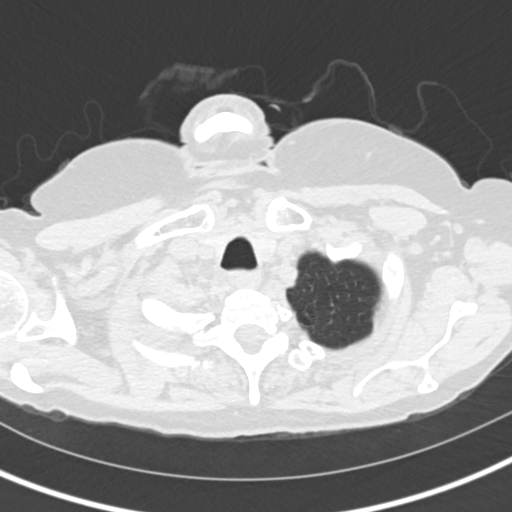

[Series 5: chest · coronal · 0.61mm/px · 3 of 137 slices shown (2 of 2)]
[im 28/137  lung]
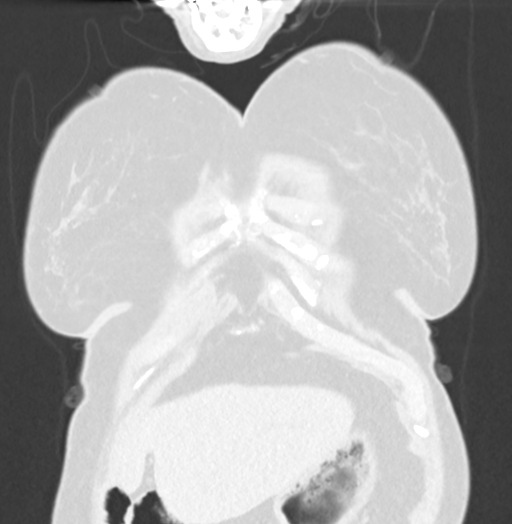
[im 55/137  lung]
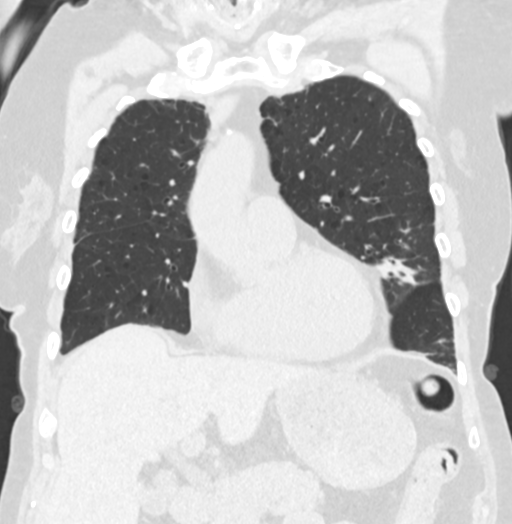
[im 82/137  lung]
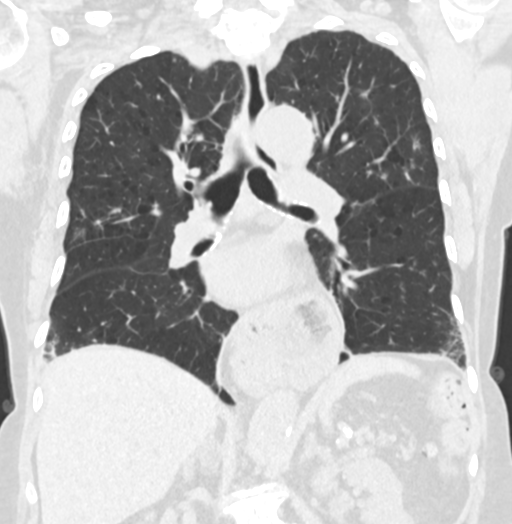

[15 of 36 positions shown; findings below may reference images not displayed]

FINDINGS: Cardiovascular: Coronary artery calcification and aortic
atherosclerotic calcification.

Mediastinum/Nodes: No axillary or supraclavicular adenopathy. Small
paratracheal lymph nodes measure up to 8 mm short axis no hilar
adenopathy. No pericardial effusion.

Large hiatal hernia posterior LEFT heart. Thickened distal
esophageal wall 5 mm (image 83/2). Centrilobular emphysema the upper
lobes.

Lungs/Pleura: Mild centrilobular emphysema the upper lobes.
Ill-defined angular ground-glass opacities in the LEFT upper lobe
(image [DATE]). A similar findings in the lateral lingula with
reticulonodular pattern in a segmental distribution. Largest nodule
measures 12 mm (image 64/3).

Within LEFT lower lobe similar nodular pattern with a 15 mm nodule
on image 73/3.

IN the peripheral RIGHT lower lobe mass lesion with central lucency
measuring 3.2 by 1.2 cm.

Finally, in the RIGHT upper lobe there is a peripheral nodule
measuring 7 mm.

In the lower lobes, subpleural cystic change without frank
honeycombing.

Upper Abdomen: Limited view of the liver, kidneys, pancreas are
unremarkable. Normal adrenal glands. Low-density lesion of the RIGHT
adrenal gland measuring 16 mm has attenuation consistent with a
benign adenoma.

Musculoskeletal: No aggressive osseous lesion
IMPRESSION: 1. Bilateral peripheral reticulonodular pattern is most consistent
chronic infection such as RICHICHI.
2. Masslike nodular consolidation in the RIGHT lower lobe with
central cavitation is likely the same process as the multilobar
reticulonodular pattern; however, in patient with smoking history
cannot exclude BRONCHOGENIC CARCINOMA. RECOMMEND FDG PET CT scan for
further characterization as a follow-up CT scan would demonstrate
minimal change in a chronic pulmonary infection.
3. Small mediastinal lymph nodes are favored reactive.
4. Upper lobe centrilobular emphysema.
5. Subpleural cystic change in the lower lobes consistent with early
interstitial lung disease.
6. Large hiatal hernia with esophageal wall thickening. Concern for
reflux esophagitis.

These results will be called to the ordering clinician or
representative by the Radiologist Assistant, and communication
documented in the PACS or zVision Dashboard.

## 2019-07-13 DIAGNOSIS — F329 Major depressive disorder, single episode, unspecified: Secondary | ICD-10-CM | POA: Diagnosis not present

## 2019-07-13 DIAGNOSIS — Z1331 Encounter for screening for depression: Secondary | ICD-10-CM | POA: Diagnosis not present

## 2019-07-13 DIAGNOSIS — E7800 Pure hypercholesterolemia, unspecified: Secondary | ICD-10-CM | POA: Diagnosis not present

## 2019-07-13 DIAGNOSIS — R5383 Other fatigue: Secondary | ICD-10-CM | POA: Diagnosis not present

## 2019-07-13 DIAGNOSIS — R000 Tachycardia, unspecified: Secondary | ICD-10-CM | POA: Diagnosis not present

## 2019-07-13 DIAGNOSIS — E039 Hypothyroidism, unspecified: Secondary | ICD-10-CM | POA: Diagnosis not present

## 2019-07-13 DIAGNOSIS — D519 Vitamin B12 deficiency anemia, unspecified: Secondary | ICD-10-CM | POA: Diagnosis not present

## 2019-07-13 DIAGNOSIS — I10 Essential (primary) hypertension: Secondary | ICD-10-CM | POA: Diagnosis not present

## 2019-07-13 DIAGNOSIS — F900 Attention-deficit hyperactivity disorder, predominantly inattentive type: Secondary | ICD-10-CM | POA: Diagnosis not present

## 2019-07-13 DIAGNOSIS — C3490 Malignant neoplasm of unspecified part of unspecified bronchus or lung: Secondary | ICD-10-CM | POA: Diagnosis not present

## 2019-07-13 DIAGNOSIS — E559 Vitamin D deficiency, unspecified: Secondary | ICD-10-CM | POA: Diagnosis not present

## 2019-07-20 DIAGNOSIS — I10 Essential (primary) hypertension: Secondary | ICD-10-CM | POA: Diagnosis not present

## 2019-07-20 DIAGNOSIS — I4510 Unspecified right bundle-branch block: Secondary | ICD-10-CM | POA: Diagnosis not present

## 2019-07-20 DIAGNOSIS — F900 Attention-deficit hyperactivity disorder, predominantly inattentive type: Secondary | ICD-10-CM | POA: Diagnosis not present

## 2020-10-29 ENCOUNTER — Emergency Department: Payer: Medicare Other

## 2020-10-29 ENCOUNTER — Other Ambulatory Visit: Payer: Self-pay

## 2020-10-29 ENCOUNTER — Emergency Department
Admission: EM | Admit: 2020-10-29 | Discharge: 2020-10-29 | Disposition: A | Discharge status: Home / Self Care | Payer: Medicare Other | Attending: Emergency Medicine | Admitting: Emergency Medicine

## 2020-10-29 DIAGNOSIS — R531 Weakness: Secondary | ICD-10-CM | POA: Diagnosis not present

## 2020-10-29 DIAGNOSIS — Z96642 Presence of left artificial hip joint: Secondary | ICD-10-CM | POA: Insufficient documentation

## 2020-10-29 DIAGNOSIS — Z96641 Presence of right artificial hip joint: Secondary | ICD-10-CM | POA: Insufficient documentation

## 2020-10-29 DIAGNOSIS — R000 Tachycardia, unspecified: Secondary | ICD-10-CM | POA: Diagnosis not present

## 2020-10-29 DIAGNOSIS — R0682 Tachypnea, not elsewhere classified: Secondary | ICD-10-CM | POA: Insufficient documentation

## 2020-10-29 DIAGNOSIS — I10 Essential (primary) hypertension: Secondary | ICD-10-CM | POA: Insufficient documentation

## 2020-10-29 DIAGNOSIS — Z87891 Personal history of nicotine dependence: Secondary | ICD-10-CM | POA: Diagnosis not present

## 2020-10-29 DIAGNOSIS — Z79899 Other long term (current) drug therapy: Secondary | ICD-10-CM | POA: Insufficient documentation

## 2020-10-29 LAB — SEDIMENTATION RATE: Sed Rate: 23 mm/hr (ref 0–30)

## 2020-10-29 LAB — COMPREHENSIVE METABOLIC PANEL
ALT: 28 U/L (ref 0–44)
AST: 36 U/L (ref 15–41)
Albumin: 3.1 g/dL — ABNORMAL LOW (ref 3.5–5.0)
Alkaline Phosphatase: 74 U/L (ref 38–126)
Anion gap: 8 (ref 5–15)
BUN: 11 mg/dL (ref 8–23)
CO2: 25 mmol/L (ref 22–32)
Calcium: 8.7 mg/dL — ABNORMAL LOW (ref 8.9–10.3)
Chloride: 108 mmol/L (ref 98–111)
Creatinine, Ser: 0.67 mg/dL (ref 0.44–1.00)
GFR, Estimated: >60 mL/min (ref >60)
Glucose, Bld: 110 mg/dL — ABNORMAL HIGH (ref 70–99)
Potassium: 3.1 mmol/L — ABNORMAL LOW (ref 3.5–5.1)
Sodium: 141 mmol/L (ref 135–145)
Total Bilirubin: 0.7 mg/dL (ref 0.3–1.2)
Total Protein: 6.6 g/dL (ref 6.5–8.1)

## 2020-10-29 LAB — LACTIC ACID, PLASMA: Lactic Acid, Venous: 1.8 mmol/L (ref 0.5–1.9)

## 2020-10-29 LAB — CBC WITH DIFFERENTIAL/PLATELET
Abs Immature Granulocytes: 0.02 10*3/uL (ref 0.00–0.07)
Basophils Absolute: 0.1 10*3/uL (ref 0.0–0.1)
Basophils Relative: 1 %
Eosinophils Absolute: 0.2 10*3/uL (ref 0.0–0.5)
Eosinophils Relative: 3 %
HCT: 36 % (ref 36.0–46.0)
Hemoglobin: 11 g/dL — ABNORMAL LOW (ref 12.0–15.0)
Immature Granulocytes: 0 %
Lymphocytes Relative: 6 %
Lymphs Abs: 0.4 10*3/uL — ABNORMAL LOW (ref 0.7–4.0)
MCH: 26 pg (ref 26.0–34.0)
MCHC: 30.6 g/dL (ref 30.0–36.0)
MCV: 85.1 fL (ref 80.0–100.0)
Monocytes Absolute: 0.7 10*3/uL (ref 0.1–1.0)
Monocytes Relative: 9 %
Neutro Abs: 6.3 10*3/uL (ref 1.7–7.7)
Neutrophils Relative %: 81 %
Platelets: 455 10*3/uL — ABNORMAL HIGH (ref 150–400)
RBC: 4.23 MIL/uL (ref 3.87–5.11)
RDW: 17.4 % — ABNORMAL HIGH (ref 11.5–15.5)
WBC: 7.8 10*3/uL (ref 4.0–10.5)
nRBC: 0 % (ref 0.0–0.2)

## 2020-10-29 LAB — BRAIN NATRIURETIC PEPTIDE: B Natriuretic Peptide: 981.2 pg/mL — ABNORMAL HIGH (ref 0.0–100.0)

## 2020-10-29 LAB — TROPONIN I (HIGH SENSITIVITY): Troponin I (High Sensitivity): 104 ng/L (ref <18)

## 2020-10-29 LAB — BLOOD GAS, VENOUS: Acid-Base Excess: 1.4 mmol/L (ref 0.0–2.0)

## 2020-10-29 LAB — FIBRIN DERIVATIVES D-DIMER (ARMC ONLY): Fibrin derivatives D-dimer (ARMC): 3467.31 ng/mL (FEU) — ABNORMAL HIGH (ref 0.00–499.00)

## 2020-10-29 NOTE — ED Notes (Signed)
Pt refusing any further exams, testing, or work up. Wants to be discharged home.

## 2020-10-29 NOTE — ED Provider Notes (Signed)
[facility] Emergency Department Provider Note   ____________________________________________   Event Date/Time   First MD Initiated Contact with Patient 10/29/20 1556     (approximate)  I have reviewed the triage vital signs and the nursing notes.   HISTORY  Chief Complaint Weakness (Not feeling well for several days)  HPI Marie Dunn is a 75 y.o. female had swelling and redness of her feet and legs several days ago that was so bad that she could not walk.  The swelling is gone down.  The redness is stayed its in the skin she has spots of erythema on her feet and hands in various areas on her skin that just look like a dermatitis.  It does not look like cellulitis at this point.  Patient has been laying in bed with some possible nausea vomiting and diarrhea although she want admitted her son is worried about this.  Patient herself says she feels well.  She is tachycardic blood pressure is elevated she is tachypneic her D-dimer is elevated over 3000 white blood count is normal with a lot of polys her BNP is elevated her troponin is elevated I have explained all this to the patient and told her she is she is at risk for blood clots which could kill her and that her heart is showing signs of injury and fluid overload but patient wants to go home.  She says she has stage IV lung cancer and she is going to die and she really just wants to get it over with.  She is making sense and understands the results of her actions.  She does not want any further testing.  I was considering IV fluid but not with the BNP and her normal BUN and creatinine and electrolytes.  I will encourage her to drink p.o. fluids.  I will have her come back if she gets worse or changes her mind.         Past Medical History:  Diagnosis Date  . ADHD (attention deficit hyperactivity disorder)   . Hypertension     Patient Active Problem List   Diagnosis Date Noted  . Adult attention deficit  disorder 08/06/2015  . Mild depression ([place]) 08/06/2015  . BP (high blood pressure) 08/06/2015  . Arthritis, degenerative 08/06/2015  . Encounter to establish care 08/06/2015  . Chronic LBP 10/15/2014  . Arthralgia of hip 11/08/2012    Past Surgical History:  Procedure Laterality Date  . ABDOMINAL HYSTERECTOMY    . left ankle surgery    . left hip replacement    . TOTAL HIP ARTHROPLASTY Right     Prior to Admission medications   Medication Sig Start Date End Date Taking? Authorizing Provider  albuterol (PROVENTIL HFA;VENTOLIN HFA) 108 (90 Base) MCG/ACT inhaler Inhale 1 puff into the lungs every 6 (six) hours as needed for wheezing or shortness of breath.    [provider]  hydrochlorothiazide (HYDRODIURIL) 25 MG tablet Take 1 tablet (25 mg total) by mouth daily. for blood pressure 08/06/15   Arlis Porta., MD  lisinopril (PRINIVIL,ZESTRIL) 10 MG tablet Take 1 tablet by mouth daily. 02/24/16   [provider]  Melatonin 5 MG TABS Take 1 tablet by mouth daily as needed.    [provider]    Allergies Ampicillin, Codeine, Morphine and related, Amoxicillin, and Prednisone  No family history on file.  Social History Social History   Tobacco Use  . Smoking status: Former Research scientist (life sciences)  . Smokeless tobacco:  Never Used  Substance Use Topics  . Alcohol use: No  . Drug use: No    Review of Systems  Constitutional: No fever/chills Eyes: No visual changes. ENT: No sore throat. Cardiovascular: Denies chest pain. Respiratory: Denies shortness of breath. Gastrointestinal: No abdominal pain.  No nausea, no vomiting.  No diarrhea.  No constipation. Genitourinary: Negative for dysuria. Musculoskeletal: Negative for back pain. Skin: Negative for rash. Neurological: Negative for headaches, focal weakness   ____________________________________________   PHYSICAL EXAM:  VITAL SIGNS: ED Triage Vitals  Enc Vitals Group     BP 10/29/20 1543 (!) 153/100      Pulse Rate 10/29/20 1543 (!) 123     Resp 10/29/20 1543 (!) 32     Temp 10/29/20 1543 99.1 F (37.3 C)     Temp Source 10/29/20 1543 Oral     SpO2 10/29/20 1543 98 %     Weight --      Height 10/29/20 1550 5\' 1"  (1.549 m)     Head Circumference --      Peak Flow --      Pain Score --      Pain Loc --      Pain Edu? --      Excl. in [place]? --     Constitutional: Alert and oriented. Well appearing and in no acute distress. Eyes: Conjunctivae are normal.  Head: Atraumatic. Nose: No congestion/rhinnorhea. Mouth/Throat: Mucous membranes are moist.  Oropharynx non-erythematous. Neck: No stridor Cardiovascular: Rapid rate, regular rhythm. Grossly normal heart sounds.  Good peripheral circulation. Respiratory: Normal respiratory effort.  No retractions. Lungs occasional scattered crackles Gastrointestinal: Soft and nontender. No distention. No abdominal bruits. Musculoskeletal: No lower extremity tenderness trace bilateral edema.  No joint effusions. Neurologic:  Normal speech and language. No gross focal neurologic deficits are appreciated.  Skin:  Skin is warm, dry and intact.  Rash as described in HPI Psychiatric: Mood and affect are normal. Speech and behavior are normal.  ____________________________________________   LABS (all labs ordered are listed, but only abnormal results are displayed)  Labs Reviewed  COMPREHENSIVE METABOLIC PANEL - Abnormal; Notable for the following components:      Result Value   Potassium 3.1 (*)    Glucose, Bld 110 (*)    Calcium 8.7 (*)    Albumin 3.1 (*)    All other components within normal limits  BRAIN NATRIURETIC PEPTIDE - Abnormal; Notable for the following components:   B Natriuretic Peptide 981.2 (*)    All other components within normal limits  CBC WITH DIFFERENTIAL/PLATELET - Abnormal; Notable for the following components:   Hemoglobin 11.0 (*)    RDW 17.4 (*)    Platelets 455 (*)    Lymphs Abs 0.4 (*)    All other components  within normal limits  FIBRIN DERIVATIVES D-DIMER ([facility] ONLY) - Abnormal; Notable for the following components:   Fibrin derivatives D-dimer [facility]) 4,540.98 (*)    All other components within normal limits  BLOOD GAS, VENOUS - Abnormal; Notable for the following components:   pCO2, Ven 43 (*)    All other components within normal limits  TROPONIN I (HIGH SENSITIVITY) - Abnormal; Notable for the following components:   Troponin I (High Sensitivity) 104 (*)    All other components within normal limits  SARS CORONAVIRUS 2 (TAT 6-24 HRS)  LACTIC ACID, PLASMA  LACTIC ACID, PLASMA  URINALYSIS, COMPLETE (UACMP) WITH MICROSCOPIC  SEDIMENTATION RATE  TROPONIN I (HIGH SENSITIVITY)   ____________________________________________  EKG  ____________________________________________  RADIOLOGY Gertha Calkin, personally viewed and evaluated these images (plain radiographs) as part of my medical decision making, as well as reviewing the written report by the radiologist.  ED MD interpretation chest x-ray read by radiology as chronic scarring there may be an elevated element of increased cephalization.  Official radiology report(s): DG Chest Portable 1 View  Result Date: 10/29/2020 CLINICAL DATA:  Weakness EXAM: PORTABLE CHEST 1 VIEW COMPARISON:  CT chest dated 03/29/2018 FINDINGS: Increased interstitial markings. Mild subpleural patchy/nodular opacities in the bilateral upper lobes and left lower lobe, likely chronic. Mild bibasilar scarring. Superimposed mild left lower lobe opacity/pneumonia is possible but is not favored. No pleural effusion or pneumothorax. Cardiomegaly. IMPRESSION: Chronic scarring in the lungs bilaterally. Superimposed infection/pneumonia is possible but is not favored. Electronically Signed   By: Julian Hy M.D.   On: 10/29/2020 16:19    ____________________________________________   PROCEDURES  Procedure(s) performed (including Critical  Care):  Procedures   ____________________________________________   INITIAL IMPRESSION / ASSESSMENT AND PLAN / ED COURSE  I offered the patient ketoconazole or other antifungal cream for her feet as she does have what appears to be bilateral athlete's foot patient says she has some at home and does not need anymore.  I discussed again with her CT scans and other treatment she says to me again that she knows she is going to die and just wants to go home and does not want to mess with anything anymore.  She does not want a CT scan she does not want a VQ scan she will not let me do a COVID test She says she is allergic to IV contrast although it is not in her allergy list.  I can really not argue with her logic.  I will let her go home.  I cannot convince her otherwise.             ____________________________________________   FINAL CLINICAL IMPRESSION(S) / ED DIAGNOSES  Final diagnoses:  Weakness  Tachycardia  Tachypnea     ED Discharge Orders    None      *Please note:  Adeli Frost was evaluated in Emergency Department on 10/29/2020 for the symptoms described in the history of present illness. She was evaluated in the context of the global COVID-19 pandemic, which necessitated consideration that the patient might be at risk for infection with the SARS-CoV-2 virus that causes COVID-19. Institutional protocols and algorithms that pertain to the evaluation of patients at risk for COVID-19 are in a state of rapid change based on information released by regulatory bodies including the CDC and federal and state organizations. These policies and algorithms were followed during the patient's care in the ED.  Some ED evaluations and interventions may be delayed as a result of limited staffing during and the pandemic.*   Note:  This document was prepared using Dragon voice recognition software and may include unintentional dictation errors.    Nena Polio, MD 10/29/20  [phone]

## 2020-10-29 NOTE — ED Triage Notes (Signed)
EMS called to transport pt to ED for evaluation; pt reports she's been feeling bad for a couple of days, may be dehydrated. Has difficulty breathing at baseline d/t lung cancer diagnosis

## 2020-10-29 NOTE — Discharge Instructions (Signed)
I really wish she would stay in the hospital and let me do some more testing.  You may be having a blood clot.  Your lab work is also suggestive of some injury to your heart and fluid overload.  I think you would benefit from staying in the hospital.  Since you do not want to I will let you go home though.  If you change your mind or feel worse please do not hesitate to return.  Please follow-up with your primary care doctor in the next few days.

## 2020-10-29 NOTE — ED Notes (Signed)
Attempted to collect specimen for COVID testing; pt vehemently refuses.

## 2020-11-04 ENCOUNTER — Other Ambulatory Visit: Payer: Self-pay

## 2020-11-04 ENCOUNTER — Emergency Department: Payer: Medicare Other

## 2020-11-04 ENCOUNTER — Inpatient Hospital Stay
Admission: EM | Admit: 2020-11-04 | Discharge: 2020-11-12 | DRG: 951 | Disposition: E | Payer: Medicare Other | Attending: Internal Medicine | Admitting: Internal Medicine

## 2020-11-04 ENCOUNTER — Inpatient Hospital Stay
Admit: 2020-11-04 | Discharge: 2020-11-04 | Disposition: A | Discharge status: Home / Self Care | Payer: Medicare Other | Attending: Internal Medicine | Admitting: Internal Medicine

## 2020-11-04 DIAGNOSIS — J9621 Acute and chronic respiratory failure with hypoxia: Secondary | ICD-10-CM | POA: Diagnosis present

## 2020-11-04 DIAGNOSIS — J439 Emphysema, unspecified: Secondary | ICD-10-CM | POA: Diagnosis present

## 2020-11-04 DIAGNOSIS — R569 Unspecified convulsions: Secondary | ICD-10-CM | POA: Diagnosis present

## 2020-11-04 DIAGNOSIS — J189 Pneumonia, unspecified organism: Secondary | ICD-10-CM

## 2020-11-04 DIAGNOSIS — Z880 Allergy status to penicillin: Secondary | ICD-10-CM

## 2020-11-04 DIAGNOSIS — I10 Essential (primary) hypertension: Secondary | ICD-10-CM | POA: Diagnosis present

## 2020-11-04 DIAGNOSIS — Z888 Allergy status to other drugs, medicaments and biological substances status: Secondary | ICD-10-CM | POA: Diagnosis not present

## 2020-11-04 DIAGNOSIS — Z96643 Presence of artificial hip joint, bilateral: Secondary | ICD-10-CM | POA: Diagnosis present

## 2020-11-04 DIAGNOSIS — R0602 Shortness of breath: Secondary | ICD-10-CM | POA: Diagnosis present

## 2020-11-04 DIAGNOSIS — I21A1 Myocardial infarction type 2: Secondary | ICD-10-CM | POA: Diagnosis present

## 2020-11-04 DIAGNOSIS — I214 Non-ST elevation (NSTEMI) myocardial infarction: Secondary | ICD-10-CM | POA: Diagnosis present

## 2020-11-04 DIAGNOSIS — Z66 Do not resuscitate: Secondary | ICD-10-CM | POA: Diagnosis present

## 2020-11-04 DIAGNOSIS — C7931 Secondary malignant neoplasm of brain: Secondary | ICD-10-CM | POA: Diagnosis not present

## 2020-11-04 DIAGNOSIS — Z681 Body mass index (BMI) 19 or less, adult: Secondary | ICD-10-CM | POA: Diagnosis not present

## 2020-11-04 DIAGNOSIS — F909 Attention-deficit hyperactivity disorder, unspecified type: Secondary | ICD-10-CM | POA: Diagnosis present

## 2020-11-04 DIAGNOSIS — R64 Cachexia: Secondary | ICD-10-CM | POA: Diagnosis present

## 2020-11-04 DIAGNOSIS — Z515 Encounter for palliative care: Secondary | ICD-10-CM | POA: Diagnosis not present

## 2020-11-04 DIAGNOSIS — J8410 Pulmonary fibrosis, unspecified: Secondary | ICD-10-CM | POA: Diagnosis present

## 2020-11-04 DIAGNOSIS — Z85118 Personal history of other malignant neoplasm of bronchus and lung: Secondary | ICD-10-CM

## 2020-11-04 DIAGNOSIS — J1282 Pneumonia due to coronavirus disease 2019: Secondary | ICD-10-CM | POA: Diagnosis not present

## 2020-11-04 DIAGNOSIS — Z87891 Personal history of nicotine dependence: Secondary | ICD-10-CM

## 2020-11-04 DIAGNOSIS — E870 Hyperosmolality and hypernatremia: Secondary | ICD-10-CM | POA: Diagnosis present

## 2020-11-04 DIAGNOSIS — C3490 Malignant neoplasm of unspecified part of unspecified bronchus or lung: Secondary | ICD-10-CM | POA: Diagnosis not present

## 2020-11-04 DIAGNOSIS — Z7189 Other specified counseling: Secondary | ICD-10-CM | POA: Diagnosis not present

## 2020-11-04 DIAGNOSIS — J9601 Acute respiratory failure with hypoxia: Secondary | ICD-10-CM

## 2020-11-04 DIAGNOSIS — R627 Adult failure to thrive: Secondary | ICD-10-CM | POA: Diagnosis not present

## 2020-11-04 DIAGNOSIS — R451 Restlessness and agitation: Secondary | ICD-10-CM | POA: Diagnosis not present

## 2020-11-04 DIAGNOSIS — Z9119 Patient's noncompliance with other medical treatment and regimen: Secondary | ICD-10-CM | POA: Diagnosis not present

## 2020-11-04 DIAGNOSIS — E43 Unspecified severe protein-calorie malnutrition: Secondary | ICD-10-CM | POA: Diagnosis present

## 2020-11-04 DIAGNOSIS — Z885 Allergy status to narcotic agent status: Secondary | ICD-10-CM | POA: Diagnosis not present

## 2020-11-04 DIAGNOSIS — U071 COVID-19: Secondary | ICD-10-CM | POA: Diagnosis present

## 2020-11-04 HISTORY — DX: Malignant (primary) neoplasm, unspecified: C80.1

## 2020-11-04 LAB — ECHOCARDIOGRAM COMPLETE
AR max vel: 1.74 cm2
AV Peak grad: 5.2 mmHg
Ao pk vel: 1.14 m/s
Area-P 1/2: 5.13 cm2
Calc EF: 39.8 %
Height: 61 in
S' Lateral: 3.38 cm
Single Plane A2C EF: 40 %
Single Plane A4C EF: 39.3 %
Weight: 1520 oz

## 2020-11-04 LAB — BASIC METABOLIC PANEL
Anion gap: 16 — ABNORMAL HIGH (ref 5–15)
BUN: 32 mg/dL — ABNORMAL HIGH (ref 8–23)
CO2: 27 mmol/L (ref 22–32)
Calcium: 9.4 mg/dL (ref 8.9–10.3)
Chloride: 103 mmol/L (ref 98–111)
Creatinine, Ser: 0.67 mg/dL (ref 0.44–1.00)
GFR, Estimated: >60 mL/min (ref >60)
Glucose, Bld: 89 mg/dL (ref 70–99)
Potassium: 3.8 mmol/L (ref 3.5–5.1)
Sodium: 146 mmol/L — ABNORMAL HIGH (ref 135–145)

## 2020-11-04 LAB — CBC
HCT: 39.2 % (ref 36.0–46.0)
Hemoglobin: 12.3 g/dL (ref 12.0–15.0)
MCH: 26 pg (ref 26.0–34.0)
MCHC: 31.4 g/dL (ref 30.0–36.0)
MCV: 82.9 fL (ref 80.0–100.0)
Platelets: 504 10*3/uL — ABNORMAL HIGH (ref 150–400)
RBC: 4.73 MIL/uL (ref 3.87–5.11)
RDW: 17.9 % — ABNORMAL HIGH (ref 11.5–15.5)
WBC: 13.5 10*3/uL — ABNORMAL HIGH (ref 4.0–10.5)
nRBC: 0.2 % (ref 0.0–0.2)

## 2020-11-04 LAB — BLOOD GAS, ARTERIAL
Acid-base deficit: 3.1 mmol/L — ABNORMAL HIGH (ref 0.0–2.0)
Allens test (pass/fail): POSITIVE — AB
Bicarbonate: 24.3 mmol/L (ref 20.0–28.0)
FIO2: 7
O2 Saturation: 89.1 %
Patient temperature: 37
pCO2 arterial: 53 mmHg — ABNORMAL HIGH (ref 32.0–48.0)
pH, Arterial: 7.27 — ABNORMAL LOW (ref 7.350–7.450)
pO2, Arterial: 65 mmHg — ABNORMAL LOW (ref 83.0–108.0)

## 2020-11-04 LAB — TROPONIN I (HIGH SENSITIVITY)
Troponin I (High Sensitivity): 182 ng/L (ref <18)
Troponin I (High Sensitivity): 181 ng/L (ref <18)

## 2020-11-04 LAB — FIBRIN DERIVATIVES D-DIMER (ARMC ONLY): Fibrin derivatives D-dimer (ARMC): 1735.4 ng/mL (FEU) — ABNORMAL HIGH (ref 0.00–499.00)

## 2020-11-04 LAB — C-REACTIVE PROTEIN: CRP: 21.2 mg/dL — ABNORMAL HIGH (ref <1.0)

## 2020-11-04 LAB — POC SARS CORONAVIRUS 2 AG -  ED: SARS Coronavirus 2 Ag: POSITIVE — AB

## 2020-11-04 LAB — BRAIN NATRIURETIC PEPTIDE: B Natriuretic Peptide: 2816.4 pg/mL — ABNORMAL HIGH (ref 0.0–100.0)

## 2020-11-04 LAB — PROTIME-INR
INR: 1.3 — ABNORMAL HIGH (ref 0.8–1.2)
Prothrombin Time: 15.9 seconds — ABNORMAL HIGH (ref 11.4–15.2)

## 2020-11-04 LAB — APTT: aPTT: 31 seconds (ref 24–36)

## 2020-11-04 MED ORDER — ADULT MULTIVITAMIN W/MINERALS CH
1.0000 | ORAL_TABLET | Freq: Every day | ORAL | Status: DC
  Administered 2020-11-04 – 2020-11-05 (×2): 1 via ORAL
  Filled 2020-11-04 (×2): qty 1

## 2020-11-04 MED ORDER — ATORVASTATIN CALCIUM 20 MG PO TABS
40.0000 mg | ORAL_TABLET | Freq: Every day | ORAL | Status: DC
  Administered 2020-11-04 – 2020-11-05 (×2): 40 mg via ORAL
  Filled 2020-11-04 (×2): qty 2

## 2020-11-04 MED ORDER — SENNOSIDES-DOCUSATE SODIUM 8.6-50 MG PO TABS: 1.0000 | ORAL_TABLET | Freq: Every evening | ORAL | Status: DC | PRN

## 2020-11-04 MED ORDER — DEXAMETHASONE SODIUM PHOSPHATE 10 MG/ML IJ SOLN
6.0000 mg | INTRAMUSCULAR | Status: DC
  Administered 2020-11-04 – 2020-11-05 (×2): 6 mg via INTRAVENOUS
  Filled 2020-11-04 (×2): qty 1

## 2020-11-04 MED ORDER — ONDANSETRON HCL 4 MG/2ML IJ SOLN
4.0000 mg | Freq: Once | INTRAMUSCULAR | Status: AC
  Administered 2020-11-04: 4 mg via INTRAVENOUS
  Filled 2020-11-04: qty 2

## 2020-11-04 MED ORDER — ASPIRIN 81 MG PO CHEW
81.0000 mg | CHEWABLE_TABLET | Freq: Every day | ORAL | Status: DC
  Administered 2020-11-04 – 2020-11-05 (×2): 81 mg via ORAL
  Filled 2020-11-04 (×2): qty 1

## 2020-11-04 MED ORDER — MELATONIN 5 MG PO TABS: 5.0000 mg | ORAL_TABLET | Freq: Every evening | ORAL | Status: DC | PRN

## 2020-11-04 MED ORDER — SODIUM CHLORIDE 0.9 % IV SOLN
200.0000 mg | Freq: Once | INTRAVENOUS | Status: AC
  Administered 2020-11-04: 200 mg via INTRAVENOUS
  Filled 2020-11-04: qty 200

## 2020-11-04 MED ORDER — OXYCODONE-ACETAMINOPHEN 5-325 MG PO TABS
1.0000 | ORAL_TABLET | Freq: Once | ORAL | Status: AC
  Administered 2020-11-04: 1 via ORAL
  Filled 2020-11-04: qty 1

## 2020-11-04 MED ORDER — SODIUM CHLORIDE 0.9 % IV SOLN
100.0000 mg | Freq: Every day | INTRAVENOUS | Status: DC
  Administered 2020-11-05: 100 mg via INTRAVENOUS
  Filled 2020-11-04: qty 20

## 2020-11-04 MED ORDER — MORPHINE SULFATE (PF) 2 MG/ML IV SOLN
2.0000 mg | INTRAVENOUS | Status: DC | PRN
Start: 2020-11-04 — End: 2020-11-06
  Administered 2020-11-04 – 2020-11-06 (×6): 2 mg via INTRAVENOUS
  Filled 2020-11-04 (×5): qty 1

## 2020-11-04 MED ORDER — ALBUTEROL SULFATE HFA 108 (90 BASE) MCG/ACT IN AERS
1.0000 | INHALATION_SPRAY | Freq: Four times a day (QID) | RESPIRATORY_TRACT | Status: DC | PRN
  Filled 2020-11-04: qty 6.7

## 2020-11-04 MED ORDER — GUAIFENESIN 100 MG/5ML PO SOLN
200.0000 mg | ORAL | Status: DC | PRN
  Filled 2020-11-04: qty 10

## 2020-11-04 MED ORDER — HEPARIN (PORCINE) 25000 UT/250ML-% IV SOLN
500.0000 [IU]/h | INTRAVENOUS | Status: DC
  Administered 2020-11-04: 500 [IU]/h via INTRAVENOUS
  Filled 2020-11-04: qty 250

## 2020-11-04 MED ORDER — IPRATROPIUM-ALBUTEROL 0.5-2.5 (3) MG/3ML IN SOLN
3.0000 mL | Freq: Once | RESPIRATORY_TRACT | Status: AC
  Administered 2020-11-04: 3 mL via RESPIRATORY_TRACT
  Filled 2020-11-04: qty 3

## 2020-11-04 MED ORDER — ACETAMINOPHEN 325 MG PO TABS: 650.0000 mg | ORAL_TABLET | Freq: Four times a day (QID) | ORAL | Status: DC | PRN

## 2020-11-04 MED ORDER — FUROSEMIDE 10 MG/ML IJ SOLN
40.0000 mg | Freq: Once | INTRAMUSCULAR | Status: AC
  Administered 2020-11-04: 40 mg via INTRAVENOUS
  Filled 2020-11-04: qty 4

## 2020-11-04 MED ORDER — HEPARIN BOLUS VIA INFUSION
2500.0000 [IU] | Freq: Once | INTRAVENOUS | Status: AC
  Administered 2020-11-04: 2500 [IU] via INTRAVENOUS
  Filled 2020-11-04: qty 2500

## 2020-11-04 MED ORDER — SODIUM CHLORIDE 0.9 % IV SOLN: 100.0000 mg | Freq: Every day | INTRAVENOUS | Status: DC

## 2020-11-04 MED ORDER — SODIUM CHLORIDE 0.9 % IV SOLN: 200.0000 mg | Freq: Once | INTRAVENOUS | Status: DC

## 2020-11-04 MED ORDER — NITROGLYCERIN 0.4 MG SL SUBL: 0.4000 mg | SUBLINGUAL_TABLET | SUBLINGUAL | Status: DC | PRN

## 2020-11-04 MED ORDER — METOPROLOL TARTRATE 5 MG/5ML IV SOLN
5.0000 mg | Freq: Once | INTRAVENOUS | Status: AC
  Administered 2020-11-04: 5 mg via INTRAVENOUS
  Filled 2020-11-04: qty 5

## 2020-11-04 MED ORDER — LISINOPRIL 10 MG PO TABS
10.0000 mg | ORAL_TABLET | Freq: Every day | ORAL | Status: DC
Start: 2020-11-04 — End: 2020-11-06
  Administered 2020-11-05: 10 mg via ORAL
  Filled 2020-11-04: qty 1

## 2020-11-04 MED ORDER — FUROSEMIDE 10 MG/ML IJ SOLN
40.0000 mg | Freq: Every day | INTRAMUSCULAR | Status: DC
  Administered 2020-11-05: 40 mg via INTRAVENOUS
  Filled 2020-11-04: qty 4

## 2020-11-04 NOTE — H&P (Addendum)
Chief Complaint: I have worsening shortness of breath for the past 5 days  HPI: Marie Dunn is an 75 y.o. female with medical history significant for metastatic squamous cell lung cancer, followed by [facility] oncology and being treated with pembrolizumab.  Other comorbidities include pulmonary fibrosis and emphysema on 2L/min of oxygen at home.  She is up-to-date with her Covid vaccination.  She has been in her usual state of health until onset of symptoms of shortness of breath with minimal exertion, associated with cough and wheezing.  Patient apparently had been seen in the ED about 6 days ago on account of suspicious cellulitis to lower extremity. She decided to leave without treatment.  She returns with symptoms as described above.  She admits to sick contacts with daughter who had COVID-19.  She denies any fever or chills.  Denies any chest pain or palpitations.  She admits to poor appetite.  On presentation, patient was noted to be hypoxic hence required up to 5 L/min of oxygen for respiratory support.  At the time of evaluation, patient was comfortable on current settings.  Past Medical History:  Diagnosis Date  . ADHD (attention deficit hyperactivity disorder)   . Cancer ([place])    lung  . Hypertension     Past Surgical History:  Procedure Laterality Date  . ABDOMINAL HYSTERECTOMY    . left ankle surgery    . left hip replacement    . TOTAL HIP ARTHROPLASTY Right     No family history on file. Social History:  reports that she has quit smoking. She has never used smokeless tobacco. She reports that she does not drink alcohol and does not use drugs.  Allergies:  Allergies  Allergen Reactions  . Ampicillin Anaphylaxis  . Codeine Other (See Comments)  . Morphine And Related Rash    Other Reaction: Intolerance  . Amoxicillin Swelling  . Prednisone Swelling    (Not in a hospital admission)   Results for orders placed or performed during the hospital encounter of 11/07/2020  (from the past 48 hour(s))  Basic metabolic panel     Status: Abnormal   Collection Time: 11/08/2020 12:42 PM  Result Value Ref Range   Sodium 146 (H) 135 - 145 mmol/L   Potassium 3.8 3.5 - 5.1 mmol/L   Chloride 103 98 - 111 mmol/L   CO2 27 22 - 32 mmol/L   Glucose, Bld 89 70 - 99 mg/dL    Comment: Glucose reference range applies only to samples taken after fasting for at least 8 hours.   BUN 32 (H) 8 - 23 mg/dL   Creatinine, Ser 0.67 0.44 - 1.00 mg/dL   Calcium 9.4 8.9 - 10.3 mg/dL   GFR, Estimated >60 >60 mL/min    Comment: (NOTE) Calculated using the CKD-EPI Creatinine Equation (2021)    Anion gap 16 (H) 5 - 15    Comment: Performed at [facility], [address] [place], [place] [address]  CBC     Status: Abnormal   Collection Time: 10/23/2020 12:42 PM  Result Value Ref Range   WBC 13.5 (H) 4.0 - 10.5 K/uL   RBC 4.73 3.87 - 5.11 MIL/uL   Hemoglobin 12.3 12.0 - 15.0 g/dL   HCT 39.2 36.0 - 46.0 %   MCV 82.9 80.0 - 100.0 fL   MCH 26.0 26.0 - 34.0 pg   MCHC 31.4 30.0 - 36.0 g/dL   RDW 17.9 (H) 11.5 - 15.5 %   Platelets 504 (H) 150 - 400  K/uL   nRBC 0.2 0.0 - 0.2 %    Comment: Performed at [facility], [place], [place] [address]  Troponin I (High Sensitivity)     Status: Abnormal   Collection Time: 11/03/2020 12:42 PM  Result Value Ref Range   Troponin I (High Sensitivity) 182 (HH) <18 ng/L    Comment: CRITICAL RESULT CALLED TO, READ BACK BY AND VERIFIED WITH DEE MCCLAIN 11/02/2020 AT 1320 BY ACR (NOTE) Elevated high sensitivity troponin I (hsTnI) values and significant  changes across serial measurements may suggest ACS but many other  chronic and acute conditions are known to elevate hsTnI results.  Refer to the "Links" section for chest pain algorithms and additional  guidance. Performed at [facility], [address] [place], [place] [address]   Brain natriuretic peptide     Status: Abnormal   Collection Time: 11/05/2020 12:42  PM  Result Value Ref Range   B Natriuretic Peptide 2,816.4 (H) 0.0 - 100.0 pg/mL    Comment: Performed at [facility], [address] [place], [place] [address]  APTT     Status: None   Collection Time: 10/24/2020  1:59 PM  Result Value Ref Range   aPTT 31 24 - 36 seconds    Comment: Performed at [facility], [address] [place], [place] [address]  Protime-INR     Status: Abnormal   Collection Time: 11/01/2020  1:59 PM  Result Value Ref Range   Prothrombin Time 15.9 (H) 11.4 - 15.2 seconds   INR 1.3 (H) 0.8 - 1.2    Comment: (NOTE) INR goal varies based on device and disease states. Performed at [facility], [place], [place] [address]   Troponin I (High Sensitivity)     Status: Abnormal   Collection Time: 11/01/2020  2:20 PM  Result Value Ref Range   Troponin I (High Sensitivity) 181 (HH) <18 ng/L    Comment: CRITICAL VALUE NOTED. VALUE IS CONSISTENT WITH PREVIOUSLY REPORTED/CALLED VALUE MJU (NOTE) Elevated high sensitivity troponin I (hsTnI) values and significant  changes across serial measurements may suggest ACS but many other  chronic and acute conditions are known to elevate hsTnI results.  Refer to the "Links" section for chest pain algorithms and additional  guidance. Performed at [facility], [place], [place] [address]   POC SARS Coronavirus 2 Ag-ED - Nasal Swab (BD Veritor Kit)     Status: Abnormal   Collection Time: 11/05/2020  3:09 PM  Result Value Ref Range   SARS Coronavirus 2 Ag POSITIVE (A) NEGATIVE    Comment: (NOTE) SARS-CoV-2 antigen PRESENT.  Positive results indicate the presence of viral antigens, but clinical correlation with patient history and other diagnostic information is necessary to determine patient infection status.  Positive results do not rule out bacterial infection or co-infection  with other viruses. False positive results are rare but can occur, and confirmatory  RT-PCR testing may be appropriate in some circumstances. The expected result is Negative.  Fact Sheet for Patients: HandmadeRecipes.com.cy  Fact Sheet for Healthcare Providers: FuneralLife.at  This test is not yet approved or cleared by the [place] FDA and  has been authorized for detection and/or diagnosis of SARS-CoV-2 by FDA under an Emergency Use Authorization (EUA).  This EUA will remain in effect (meaning this test can be used) for the duration of  the COVID-19 declaration under Section 564(b)(1) of the Act, 21 U.S.C. section [phone]( b)(1), unless the  authorization is terminated or revoked sooner.     DG Chest 2 View  Result Date: 11/07/2020 CLINICAL DATA:  Short of breath EXAM: CHEST - 2 VIEW COMPARISON:  10/29/2020 FINDINGS: Progression of widespread bilateral airspace disease. Possible minimal pleural effusion bilaterally Cardiac enlargement.  Vascularity does not appear congested. IMPRESSION: Extensive diffuse bilateral airspace disease. Probable COVID pneumonia. Correlate with symptoms of heart failure and edema. Electronically Signed   By: Franchot Gallo M.D.   On: 10/28/2020 13:04    Review of Systems  Constitutional: Positive for appetite change.  HENT: Positive for congestion.   Respiratory: Positive for shortness of breath and wheezing. Negative for cough, choking and chest tightness.   Cardiovascular: Negative.   Gastrointestinal: Negative.   Genitourinary: Negative.   Musculoskeletal: Negative.   Skin: Negative.   Neurological: Negative.   Psychiatric/Behavioral: Negative.     Blood pressure (!) 153/126, pulse 99, temperature 98.4 F (36.9 C), resp. rate (!) 28, height _0  (1.549 m), weight 43.1 kg, SpO2 (!) 86 %. Physical Exam Constitutional:      Appearance: She is well-developed.  Neck:     Vascular: JVD present.  Cardiovascular:     Rate and Rhythm: Regular rhythm. Tachycardia present.   Pulmonary:     Effort: Respiratory distress present.     Breath sounds: Examination of the right-lower field reveals decreased breath sounds. Examination of the left-lower field reveals decreased breath sounds. Decreased breath sounds present.     Comments: Coarse breath sounds bilaterally Chest:     Chest wall: No deformity.  Abdominal:     General: Bowel sounds are normal.     Palpations: Abdomen is soft.  Musculoskeletal:     Cervical back: Normal range of motion and neck supple.  Skin:    General: Skin is warm.     Capillary Refill: Capillary refill takes less than 2 seconds.  Neurological:     General: No focal deficit present.     Mental Status: She is alert and oriented to person, place, and time.      Assessment/Plan Acute hypoxic respiratory failure secondary to COVID-19 pneumonia infection superimposed on pulmonary fibrosis and emphysema.  Interval worsening bilateral x-ray findings.  Patient is high risk for decompensation due to age and multiple pulmonary comorbidities.  Patient will be admitted to stepdown unit for further treatment and management.  Optimize treatment with bronchodilators, Decadron and remdesivir.  Follow-up with inflammatory markers that include CRP and D-dimers.  Elevated cardiac markers: Consistent with NSTEMI.  Patient is asymptomatic.  I suspect type II NSTEMI due to demand ischemia from hypoxia and COVID-19 infection.  Patient was initiated with heparin drip in the ER.  Will cycle cardiac enzymes and EKGs.  Cardiology has been consulted to evaluate and advise further management.  As needed nitro if chest pain.  Aspirin and statins were initiated.  Small bilateral pleural effusion: Associated elevated BNP.  Echocardiogram for evaluation of left ventricular ejection fraction pending.  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  Patient had previously been on Pemprozulimab for treatment.  Patient follows up with [facility]  oncology.  Outpatient follow-up advised.  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  Patient currently not on any AEDs. Per Care everywhere, patient was seen by [facility] neurology and did not recommend continuation of AED's. Last MRI was done in 07/28/2020 with no acute findings reported done.  Chronic essential hypertension: Blood pressure accelerated.  Continue with losartan.  History of mild ADHD  Mild Hypernatremia-likely due to poor free water intake.  VTE prophylaxis: Patient is on heparin drip for NSTEMI (started by ED).She denies nay known bleeding diathesis. Provided patient w/ verbal instructions concerning treatemtn with heparin. Patient verbalized understanding of the above.   Addendum: Discussed with patient the need for decadron treatment. Steroids causes her to gain weight but no significant adverse reactions including bronchoconstriction or anaphylactic reaction reported.  [place], MD 10/20/2020, 3:58 PM

## 2020-11-04 NOTE — Progress Notes (Signed)
*  PRELIMINARY RESULTS* Echocardiogram 2D Echocardiogram has been performed.  Marie Dunn 10/26/2020, 7:09 PM

## 2020-11-04 NOTE — ED Notes (Signed)
ED Provider at bedside. 

## 2020-11-04 NOTE — ED Notes (Signed)
Respiratory at bedside.

## 2020-11-04 NOTE — ED Provider Notes (Signed)
[facility] Emergency Department Provider Note    Event Date/Time   First MD Initiated Contact with Patient 10/18/2020 1312     (approximate)  I have reviewed the triage vital signs and the nursing notes.   HISTORY  Chief Complaint Shortness of Breath    HPI Marie Dunn is a 75 y.o. female   with the below listed past medical history presents to the ER for worsening shortness of breath.  Patient found to be hypoxic on room air requiring supplemental oxygen 6 L.  She was desaturating down to the mid 50s.  Does have a history of COPD as well as lung cancer.  Did recently get seen in the ER but left without treatment.  Lives at home alone.  No known sick contacts.   Past Medical History:  Diagnosis Date  . ADHD (attention deficit hyperactivity disorder)   . Cancer ([place])    lung  . Hypertension    No family history on file. Past Surgical History:  Procedure Laterality Date  . ABDOMINAL HYSTERECTOMY    . left ankle surgery    . left hip replacement    . TOTAL HIP ARTHROPLASTY Right    Patient Active Problem List   Diagnosis Date Noted  . Adult attention deficit disorder 08/06/2015  . Mild depression ([place]) 08/06/2015  . BP (high blood pressure) 08/06/2015  . Arthritis, degenerative 08/06/2015  . Encounter to establish care 08/06/2015  . Chronic LBP 10/15/2014  . Arthralgia of hip 11/08/2012      Prior to Admission medications   Medication Sig Start Date End Date Taking? Authorizing Provider  albuterol (PROVENTIL HFA;VENTOLIN HFA) 108 (90 Base) MCG/ACT inhaler Inhale 1 puff into the lungs every 6 (six) hours as needed for wheezing or shortness of breath.    [provider]  hydrochlorothiazide (HYDRODIURIL) 25 MG tablet Take 1 tablet (25 mg total) by mouth daily. for blood pressure 08/06/15   Arlis Porta., MD  lisinopril (PRINIVIL,ZESTRIL) 10 MG tablet Take 1 tablet by mouth daily. 02/24/16   [provider]   Melatonin 5 MG TABS Take 1 tablet by mouth daily as needed.    [provider]    Allergies Ampicillin, Codeine, Morphine and related, Amoxicillin, and Prednisone    Social History Social History   Tobacco Use  . Smoking status: Former Research scientist (life sciences)  . Smokeless tobacco: Never Used  Substance Use Topics  . Alcohol use: No  . Drug use: No    Review of Systems Patient denies headaches, rhinorrhea, blurry vision, numbness, shortness of breath, chest pain, edema, cough, abdominal pain, nausea, vomiting, diarrhea, dysuria, fevers, rashes or hallucinations unless otherwise stated above in HPI. ____________________________________________   PHYSICAL EXAM:  VITAL SIGNS: Vitals:   10/21/2020 1219 11/02/2020 1238  BP: (!) 153/126   Pulse: 99   Resp: (!) 28   Temp:  98.4 F (36.9 C)  SpO2: (!) 86%     Constitutional: Alert and oriented.  Eyes: Conjunctivae are normal.  Head: Atraumatic. Nose: No congestion/rhinnorhea. Mouth/Throat: Mucous membranes are moist.   Neck: No stridor. Painless ROM.  Cardiovascular: Normal rate, regular rhythm. Grossly normal heart sounds.  Good peripheral circulation. Respiratory: mild tachypnea with + use of accessory muscles, diffuse inspiratory crackles Gastrointestinal: Soft and nontender. No distention. No abdominal bruits. No CVA tenderness. Genitourinary:  Musculoskeletal: No lower extremity tenderness nor edema.  No joint effusions. Neurologic:  Normal speech and language. No gross focal neurologic deficits are appreciated. No facial droop  Skin:  Skin is warm, dry and intact. No rash noted. Psychiatric: Mood and affect are normal. Speech and behavior are normal.  ____________________________________________   LABS (all labs ordered are listed, but only abnormal results are displayed)  Results for orders placed or performed during the hospital encounter of 11/07/2020 (from the past 24 hour(s))  Basic metabolic panel     Status: Abnormal    Collection Time: 10/20/2020 12:42 PM  Result Value Ref Range   Sodium 146 (H) 135 - 145 mmol/L   Potassium 3.8 3.5 - 5.1 mmol/L   Chloride 103 98 - 111 mmol/L   CO2 27 22 - 32 mmol/L   Glucose, Bld 89 70 - 99 mg/dL   BUN 32 (H) 8 - 23 mg/dL   Creatinine, Ser 0.67 0.44 - 1.00 mg/dL   Calcium 9.4 8.9 - 10.3 mg/dL   GFR, Estimated >60 >60 mL/min   Anion gap 16 (H) 5 - 15  CBC     Status: Abnormal   Collection Time: 10/29/2020 12:42 PM  Result Value Ref Range   WBC 13.5 (H) 4.0 - 10.5 K/uL   RBC 4.73 3.87 - 5.11 MIL/uL   Hemoglobin 12.3 12.0 - 15.0 g/dL   HCT 39.2 36.0 - 46.0 %   MCV 82.9 80.0 - 100.0 fL   MCH 26.0 26.0 - 34.0 pg   MCHC 31.4 30.0 - 36.0 g/dL   RDW 17.9 (H) 11.5 - 15.5 %   Platelets 504 (H) 150 - 400 K/uL   nRBC 0.2 0.0 - 0.2 %  Troponin I (High Sensitivity)     Status: Abnormal   Collection Time: 10/31/2020 12:42 PM  Result Value Ref Range   Troponin I (High Sensitivity) 182 (HH) <18 ng/L  Brain natriuretic peptide     Status: Abnormal   Collection Time: 11/09/2020 12:42 PM  Result Value Ref Range   B Natriuretic Peptide 2,816.4 (H) 0.0 - 100.0 pg/mL  APTT     Status: None   Collection Time: 11/10/2020  1:59 PM  Result Value Ref Range   aPTT 31 24 - 36 seconds  Protime-INR     Status: Abnormal   Collection Time: 11/05/2020  1:59 PM  Result Value Ref Range   Prothrombin Time 15.9 (H) 11.4 - 15.2 seconds   INR 1.3 (H) 0.8 - 1.2  Troponin I (High Sensitivity)     Status: Abnormal   Collection Time: 10/26/2020  2:20 PM  Result Value Ref Range   Troponin I (High Sensitivity) 181 (HH) <18 ng/L  POC SARS Coronavirus 2 Ag-ED - Nasal Swab (BD Veritor Kit)     Status: Abnormal   Collection Time: 11/05/2020  3:09 PM  Result Value Ref Range   SARS Coronavirus 2 Ag POSITIVE (A) NEGATIVE   ____________________________________________  EKG My review and personal interpretation at Time: 12:26   Indication: sob  Rate: 125  Rhythm: sinus Axis: normal Other: normal intervals, no  stemi, nonspecific st abn ____________________________________________  RADIOLOGY  I personally reviewed all radiographic images ordered to evaluate for the above acute complaints and reviewed radiology reports and findings.  These findings were personally discussed with the patient.  Please see medical record for radiology report.  ____________________________________________   PROCEDURES  Procedure(s) performed:  .Critical Care Performed by: Merlyn Lot, MD Authorized by: Merlyn Lot, MD   Critical care provider statement:    Critical care time (minutes):  35   Critical care time was exclusive of:  Separately billable procedures and treating other  patients   Critical care was time spent personally by me on the following activities:  Development of treatment plan with patient or surrogate, discussions with consultants, evaluation of patient's response to treatment, examination of patient, obtaining history from patient or surrogate, ordering and performing treatments and interventions, ordering and review of laboratory studies, ordering and review of radiographic studies, pulse oximetry, re-evaluation of patient's condition and review of old charts      Critical Care performed: yes ____________________________________________   INITIAL IMPRESSION / [place] / ED COURSE  Pertinent labs & imaging results that were available during my care of the patient were reviewed by me and considered in my medical decision making (see chart for details).   DDX: covid19, Asthma, copd, CHF, pna, ptx, malignancy, Pe, anemia   Davianna Deutschman is a 76 y.o. who presents to the ED with symptoms as described above.  Patient is afebrile but hypoxic.  Requiring supplemental oxygen.  On presentation likely multifactorial given her history of COPD as well as CHF and also presentation concerning for Covid 19.  She is Covid positive will give remdesivir Decadron.  Will give Lasix as  she does appear to have some component of CHF.  I am going to heparinize her as her troponin is elevated and gave aspirin.  She is not able to tolerate CTA due to contrast allergy.  Will discuss with hospitalist     The patient was evaluated in Emergency Department today for the symptoms described in the history of present illness. He/she was evaluated in the context of the global COVID-19 pandemic, which necessitated consideration that the patient might be at risk for infection with the SARS-CoV-2 virus that causes COVID-19. Institutional protocols and algorithms that pertain to the evaluation of patients at risk for COVID-19 are in a state of rapid change based on information released by regulatory bodies including the CDC and federal and state organizations. These policies and algorithms were followed during the patient's care in the ED.  As part of my medical decision making, I reviewed the following data within the [place] notes reviewed and incorporated, Labs reviewed, notes from prior ED visits and [place] Controlled Substance Database   ____________________________________________   FINAL CLINICAL IMPRESSION(S) / ED DIAGNOSES  Final diagnoses:  Acute respiratory failure with hypoxia ([place])  COVID-19 virus infection      NEW MEDICATIONS STARTED DURING THIS VISIT:  New Prescriptions   No medications on file     Note:  This document was prepared using Dragon voice recognition software and may include unintentional dictation errors.    Merlyn Lot, MD 10/29/2020 [phone]

## 2020-11-04 NOTE — ED Triage Notes (Addendum)
Pt comes into the ED via EMS from [place], with c/o SOB has a hx of COPD, states she had a flare up with SOb since yesterday, duonebs x2 given PTA 91%RA, pt on 4L [place] at 94% Daughter tested covid + last week but stats she has not been in contact with them Taking mucinex at home Pt has a hx of lung CA., last chemo was in september  during triage pt O2 sats are between 86-94% on 4L  Pt is tachypnic with audible wheezing and crackles

## 2020-11-04 NOTE — ED Notes (Signed)
Hospitalist at bedside, advised that he ordered bi-pap for pt. Called Rt and advised of order

## 2020-11-04 NOTE — Progress Notes (Signed)
[place] for heparin Indication: chest pain/ACS  Allergies  Allergen Reactions  . Ampicillin Anaphylaxis  . Codeine Other (See Comments)  . Morphine And Related Rash    Other Reaction: Intolerance  . Amoxicillin Swelling  . Prednisone Swelling    Patient Measurements: Height: 5\' 1"  (154.9 cm) Weight: 43.1 kg (95 lb) IBW/kg (Calculated) : 47.8 Heparin Dosing Weight: 43 kg  Vital Signs: Temp: 98.4 F (36.9 C) (01/24 1238) Temp Source: Oral (01/24 1219) BP: 153/126 (01/24 1219) Pulse Rate: 99 (01/24 1219)  Labs: Recent Labs    10/23/2020 1242 11/07/2020 1359 10/30/2020 1420  HGB 12.3  --   --   HCT 39.2  --   --   PLT 504*  --   --   APTT  --  31  --   LABPROT  --  15.9*  --   INR  --  1.3*  --   CREATININE 0.67  --   --   TROPONINIHS 182*  --  181*    Estimated Creatinine Clearance: 40.1 mL/min (by C-G formula based on SCr of 0.67 mg/dL).   Medical History: Past Medical History:  Diagnosis Date  . ADHD (attention deficit hyperactivity disorder)   . Cancer ([place])    lung  . Hypertension      Assessment: 75 year old female admitted with SOB. No anticoagulation identified PTA. Pharmacy consult for heparin drip.  Goal of Therapy:  Heparin level 0.3-0.7 units/ml Monitor platelets by anticoagulation protocol: Yes   Plan:  Heparin 2500 unit bolus followed by heparin drip at 500 units/hr. Check HL at 2300. CBC daily while on heparin drip.  Tawnya Crook, PharmD 10/13/2020,3:42 PM

## 2020-11-04 NOTE — Progress Notes (Signed)
Remdesivir - Pharmacy Brief Note   O:  ALT: 28 CXR: Extensive diffuse bilateral airspace disease. Probable COVID pneumonia. Correlate with symptoms of heart failure and edema. SpO2: 70-90% on 4 L [place]   A/P:  Remdesivir 200 mg IVPB once followed by 100 mg IVPB daily x 4 days.   Dorena Bodo, PharmD 10/12/2020 3:26 PM

## 2020-11-05 ENCOUNTER — Other Ambulatory Visit: Payer: Self-pay

## 2020-11-05 ENCOUNTER — Encounter: Payer: Self-pay | Admitting: Internal Medicine

## 2020-11-05 DIAGNOSIS — U071 COVID-19: Secondary | ICD-10-CM | POA: Diagnosis not present

## 2020-11-05 DIAGNOSIS — C3490 Malignant neoplasm of unspecified part of unspecified bronchus or lung: Secondary | ICD-10-CM | POA: Diagnosis not present

## 2020-11-05 DIAGNOSIS — R451 Restlessness and agitation: Secondary | ICD-10-CM | POA: Diagnosis not present

## 2020-11-05 DIAGNOSIS — Z515 Encounter for palliative care: Principal | ICD-10-CM

## 2020-11-05 DIAGNOSIS — Z7189 Other specified counseling: Secondary | ICD-10-CM

## 2020-11-05 DIAGNOSIS — J9601 Acute respiratory failure with hypoxia: Secondary | ICD-10-CM | POA: Diagnosis not present

## 2020-11-05 LAB — HEPARIN LEVEL (UNFRACTIONATED)
Heparin Unfractionated: <0.1 IU/mL — ABNORMAL LOW (ref 0.30–0.70)
Heparin Unfractionated: <0.1 IU/mL — ABNORMAL LOW (ref 0.30–0.70)

## 2020-11-05 LAB — CBC WITH DIFFERENTIAL/PLATELET
Abs Immature Granulocytes: 0.09 10*3/uL — ABNORMAL HIGH (ref 0.00–0.07)
Basophils Absolute: 0 10*3/uL (ref 0.0–0.1)
Basophils Relative: 0 %
Eosinophils Absolute: 0 10*3/uL (ref 0.0–0.5)
Eosinophils Relative: 0 %
HCT: 39.2 % (ref 36.0–46.0)
Hemoglobin: 11.7 g/dL — ABNORMAL LOW (ref 12.0–15.0)
Immature Granulocytes: 1 %
Lymphocytes Relative: 5 %
Lymphs Abs: 0.6 10*3/uL — ABNORMAL LOW (ref 0.7–4.0)
MCH: 25.6 pg — ABNORMAL LOW (ref 26.0–34.0)
MCHC: 29.8 g/dL — ABNORMAL LOW (ref 30.0–36.0)
MCV: 85.8 fL (ref 80.0–100.0)
Monocytes Absolute: 0.3 10*3/uL (ref 0.1–1.0)
Monocytes Relative: 2 %
Neutro Abs: 10.4 10*3/uL — ABNORMAL HIGH (ref 1.7–7.7)
Neutrophils Relative %: 92 %
Platelets: 446 10*3/uL — ABNORMAL HIGH (ref 150–400)
RBC: 4.57 MIL/uL (ref 3.87–5.11)
RDW: 18.2 % — ABNORMAL HIGH (ref 11.5–15.5)
Smear Review: NORMAL
WBC: 11.3 10*3/uL — ABNORMAL HIGH (ref 4.0–10.5)
nRBC: 0.3 % — ABNORMAL HIGH (ref 0.0–0.2)

## 2020-11-05 LAB — MAGNESIUM: Magnesium: 2.2 mg/dL (ref 1.7–2.4)

## 2020-11-05 MED ORDER — ZINC SULFATE 220 (50 ZN) MG PO CAPS
220.0000 mg | ORAL_CAPSULE | Freq: Every day | ORAL | Status: DC
  Filled 2020-11-05: qty 1

## 2020-11-05 MED ORDER — HEPARIN (PORCINE) 25000 UT/250ML-% IV SOLN
800.0000 [IU]/h | INTRAVENOUS | Status: DC
  Administered 2020-11-05: 800 [IU]/h via INTRAVENOUS
  Administered 2020-11-05: 650 [IU]/h via INTRAVENOUS
  Filled 2020-11-05: qty 250

## 2020-11-05 MED ORDER — ENOXAPARIN SODIUM 40 MG/0.4ML ~~LOC~~ SOLN
40.0000 mg | SUBCUTANEOUS | Status: DC
  Administered 2020-11-05: 40 mg via SUBCUTANEOUS
  Filled 2020-11-05: qty 0.4

## 2020-11-05 MED ORDER — ASCORBIC ACID 500 MG PO TABS
500.0000 mg | ORAL_TABLET | Freq: Every day | ORAL | Status: DC
Start: 2020-11-05 — End: 2020-11-06
  Filled 2020-11-05: qty 1

## 2020-11-05 MED ORDER — METOPROLOL TARTRATE 5 MG/5ML IV SOLN
2.5000 mg | INTRAVENOUS | Status: DC | PRN
  Administered 2020-11-05: 2.5 mg via INTRAVENOUS
  Filled 2020-11-05: qty 5

## 2020-11-05 MED ORDER — HEPARIN BOLUS VIA INFUSION
1300.0000 [IU] | Freq: Once | INTRAVENOUS | Status: AC
  Administered 2020-11-05: 1300 [IU] via INTRAVENOUS
  Filled 2020-11-05: qty 1300

## 2020-11-05 NOTE — ED Notes (Signed)
Meal tray given. Pt not alert enough to eat. Pt faintly stated "I want to rest"

## 2020-11-05 NOTE — Hospital Course (Signed)
75 y.o. female with K/H/O metastatic squamous cell lung cancer, followed by [facility] oncology and being treated with pembrolizumab, pulmonary fibrosis and emphysema on 2L/min of oxygen at home is admitted for acute hypoxic resp failure due to covid pna  1/25: threatening to leave AMA. Finally agreed to stay.

## 2020-11-05 NOTE — Progress Notes (Signed)
[place] for heparin Indication: chest pain/ACS  Allergies  Allergen Reactions  . Ampicillin Anaphylaxis  . Codeine Other (See Comments)  . Morphine And Related Rash    Other Reaction: Intolerance  . Amoxicillin Swelling  . Prednisone Swelling    Patient Measurements: Height: 5\' 1"  (154.9 cm) Weight: 43.1 kg (95 lb) IBW/kg (Calculated) : 47.8 Heparin Dosing Weight: 43 kg  Vital Signs: BP: 136/80 (01/25 0000) Pulse Rate: 100 (01/25 0000)  Labs: Recent Labs    10/18/2020 1242 10/17/2020 1359 11/03/2020 1420 11/01/2020 2300  HGB 12.3  --   --   --   HCT 39.2  --   --   --   PLT 504*  --   --   --   APTT  --  31  --   --   LABPROT  --  15.9*  --   --   INR  --  1.3*  --   --   HEPARINUNFRC  --   --   --  <0.10*  CREATININE 0.67  --   --   --   TROPONINIHS 182*  --  181*  --     Estimated Creatinine Clearance: 40.1 mL/min (by C-G formula based on SCr of 0.67 mg/dL).   Medical History: Past Medical History:  Diagnosis Date  . ADHD (attention deficit hyperactivity disorder)   . Cancer ([place])    lung  . Hypertension      Assessment: 75 year old female admitted with SOB. No anticoagulation identified PTA. Pharmacy consult for heparin drip.  Goal of Therapy:  Heparin level 0.3-0.7 units/ml Monitor platelets by anticoagulation protocol: Yes   01/24 2300 HL < 0.10    Plan:  Ordered heparin bolus of 1300 unit bolus and increased heparin drip to 650 units/hr. Check HL at 1130. CBC daily while on heparin drip.  Renda Rolls, PharmD, [facility] 11/05/2020 3:22 AM

## 2020-11-05 NOTE — ED Notes (Signed)
No change in condition, will continue to monitor.  

## 2020-11-05 NOTE — ED Notes (Signed)
Pt placed on 6L HFNC after repeatedly removing BiPAP mask. Pt educated on need to keep mask on but pt refused. Pt tolerated HFNC well. Hospitalist made aware.

## 2020-11-05 NOTE — ED Notes (Signed)
Pt is not alert. She is only moaning and groaning at the moment. This RN could get an oral temp on pt and O2 monitor is not able to pick up reading on pt as well. Previous Therapist, sports, [employer], notified provider. Pt HR and BP stable at this time.

## 2020-11-05 NOTE — ED Notes (Signed)
Pt continuously asking for water, small sips given

## 2020-11-05 NOTE — ED Notes (Signed)
Attempted to give pt PO meds and pt could not swallow. MD Damita Dunnings notified.

## 2020-11-05 NOTE — Progress Notes (Signed)
[place] for heparin Indication: chest pain/ACS  Allergies  Allergen Reactions  . Ampicillin Anaphylaxis  . Codeine Other (See Comments)  . Morphine And Related Rash    Other Reaction: Intolerance  . Amoxicillin Swelling  . Prednisone Swelling    Patient Measurements: Height: 5\' 1"  (154.9 cm) Weight: 43.1 kg (95 lb) IBW/kg (Calculated) : 47.8 Heparin Dosing Weight: 43 kg  Vital Signs: Temp: 99.9 F (37.7 C) (01/25 1543) BP: 112/58 (01/25 1527) Pulse Rate: 113 (01/25 1131)  Labs: Recent Labs    10/17/2020 1242 10/20/2020 1359 10/20/2020 1420 10/20/2020 2300 11/05/20 0500 11/05/20 1358  HGB 12.3  --   --   --  11.7*  --   HCT 39.2  --   --   --  39.2  --   PLT 504*  --   --   --  446*  --   APTT  --  31  --   --   --   --   LABPROT  --  15.9*  --   --   --   --   INR  --  1.3*  --   --   --   --   HEPARINUNFRC  --   --   --  <0.10*  --  <0.10*  CREATININE 0.67  --   --   --   --   --   TROPONINIHS 182*  --  181*  --   --   --     Estimated Creatinine Clearance: 40.1 mL/min (by C-G formula based on SCr of 0.67 mg/dL).   Medical History: Past Medical History:  Diagnosis Date  . ADHD (attention deficit hyperactivity disorder)   . Cancer ([place])    lung  . Hypertension      Assessment: 75 year old female admitted with SOB. No anticoagulation identified PTA. Pharmacy consult for heparin drip.  Goal of Therapy:  Heparin level 0.3-0.7 units/ml Monitor platelets by anticoagulation protocol: Yes   01/24 2300 HL < 0.10 01/25 1358 HL < 0.10    Plan:  Heparin subtherapeutic. Bolus 1300 units followed by increase in heparin drip at 800 units/hr. Recheck HL 1/26 at 0100. CBC daily while on heparin drip.  Dorena Bodo, PharmD Clinical Pharmacist 11/05/2020 3:52 PM

## 2020-11-05 NOTE — ED Notes (Signed)
Pt HR was fluctuating form 110s-150s. Repeat EKG completed  MD, Damita Dunnings notified.

## 2020-11-05 NOTE — Progress Notes (Signed)
[facility] [place] NAME: Marie Dunn    MR#:  [id number]  DATE OF BIRTH:  05-Feb-1946  SUBJECTIVE:  CHIEF COMPLAINT:   Chief Complaint  Patient presents with  . Shortness of Breath  She keeps repeating - give me gingeral, chicken soup and something to eat otherwise I'm leaving now. If u let me have this, I will take 2-3 hrs and think about it. She is not thinking rationally. removed BiPAP and on HFNC REVIEW OF SYSTEMS:  Review of Systems  Constitutional: Positive for malaise/fatigue. Negative for chills, diaphoresis, fever and weight loss.  HENT: Negative for ear discharge, ear pain, hearing loss, nosebleeds, sore throat and tinnitus.   Eyes: Negative for blurred vision and pain.  Respiratory: Positive for cough and shortness of breath. Negative for hemoptysis and wheezing.   Cardiovascular: Negative for chest pain, palpitations, orthopnea, leg swelling and PND.  Gastrointestinal: Positive for nausea. Negative for abdominal pain, blood in stool, constipation, diarrhea, heartburn and vomiting.  Genitourinary: Negative for dysuria, frequency and urgency.  Musculoskeletal: Negative for back pain and myalgias.  Skin: Negative for itching and rash.  Neurological: Negative for dizziness, tingling, tremors, speech change, focal weakness, seizures, weakness and headaches.  Endo/Heme/Allergies: Does not bruise/bleed easily.  Psychiatric/Behavioral: Negative for depression. The patient is nervous/anxious.    DRUG ALLERGIES:   Allergies  Allergen Reactions  . Ampicillin Anaphylaxis  . Codeine Other (See Comments)  . Morphine And Related Rash    Other Reaction: Intolerance  . Amoxicillin Swelling  . Prednisone Swelling   VITALS:  Blood pressure 128/84, pulse (!) 111, temperature 99 F (37.2 C), temperature source Axillary, resp. rate 16, height 5\' 1"  (1.549 m), weight 43.1 kg, SpO2 91 %. PHYSICAL EXAMINATION:  Physical Exam Constitutional:      General:  She is in acute distress.     Appearance: She is cachectic. She is ill-appearing.  HENT:     Head: Normocephalic and atraumatic.  Eyes:     Extraocular Movements: EOM normal.     Conjunctiva/sclera: Conjunctivae normal.     Pupils: Pupils are equal, round, and reactive to light.  Neck:     Thyroid: No thyromegaly.     Trachea: No tracheal deviation.  Cardiovascular:     Rate and Rhythm: Normal rate and regular rhythm.     Heart sounds: Normal heart sounds.  Pulmonary:     Effort: Tachypnea and accessory muscle usage present. No respiratory distress.     Breath sounds: Examination of the right-lower field reveals decreased breath sounds, wheezing and rhonchi. Examination of the left-lower field reveals decreased breath sounds, wheezing and rhonchi. Decreased breath sounds, wheezing and rhonchi present.  Chest:     Chest wall: No tenderness.  Abdominal:     General: Bowel sounds are normal. There is no distension.     Palpations: Abdomen is soft.     Tenderness: There is no abdominal tenderness.  Musculoskeletal:        General: Normal range of motion.     Cervical back: Normal range of motion and neck supple.  Skin:    General: Skin is warm and dry.     Findings: No rash.  Neurological:     Mental Status: She is alert and oriented to person, place, and time.     Cranial Nerves: No cranial nerve deficit.  Psychiatric:        Mood and Affect: Mood is anxious.        Behavior:  Behavior is agitated.        Judgment: Judgment is impulsive.    LABORATORY PANEL:  Female CBC Recent Labs  Lab 11/05/20 0500  WBC 11.3*  HGB 11.7*  HCT 39.2  PLT 446*   ------------------------------------------------------------------------------------------------------------------ Chemistries  Recent Labs  Lab 10/22/2020 1242 11/05/20 0500  NA 146*  --   K 3.8  --   CL 103  --   CO2 27  --   GLUCOSE 89  --   BUN 32*  --   CREATININE 0.67  --   CALCIUM 9.4  --   MG  --  2.2    RADIOLOGY:  No results found. ASSESSMENT AND PLAN:  75 y.o. female with K/H/O metastatic squamous cell lung cancer, followed by [facility] oncology and being treated with pembrolizumab, pulmonary fibrosis and emphysema on 2L/min of oxygen at home is admitted for acute hypoxic resp failure due to covid pna  1/25: threatening to leave AMA. Finally agreed to stay.  Acute on chronic hypoxic resp failure due to covid pneumonia with underlying pulmo fibrosis and emphysema Was on BiPAP but she kept removing and transitioned to 6L HFNC now. 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  Patient currently not on any AEDs. Per Care everywhere, patient was seen by [facility] neurology and did not recommend continuation of AED's. Last MRI was done in 07/28/2020 with no acute findings reported done.  Chronic essential hypertension:  continue losartan.  History of mild ADHD  Severe protein calorie malnutrition Body mass index is 17.95 kg/m.     Status is: Inpatient  Remains inpatient appropriate because:Inpatient level of care appropriate due to severity of illness   Dispo: The patient is from: Home              Anticipated d/c is to: Home              Anticipated d/c date is: 3 days              Patient currently is not medically stable to d/c.   Difficult to place patient No   DVT prophylaxis:       SCDs Start: 10/28/2020 1545     Family Communication: updated patient's daughter over phone   All the records are reviewed and case discussed with Care Management/Social Worker. Management plans discussed with the patient, family and they are in  agreement.  CODE STATUS: DNR Level of care: Stepdown  TOTAL TIME TAKING CARE OF THIS PATIENT: 35 minutes.   More than 50% of the time was spent in counseling/coordination of care: YES  POSSIBLE D/C IN 3-4 DAYS, DEPENDING ON CLINICAL CONDITION.   Max Sane M.D on 11/05/2020 at 9:03 PM  [facility]   CC: Primary care physician; System, Provider Not In  Note: This dictation was prepared with Dragon dictation along with smaller phrase technology. Any transcriptional errors that result from this process are unintentional.

## 2020-11-05 NOTE — ED Notes (Addendum)
This RN went in to check on pt who is not as responsive as earlier. Pt answering questions with moans and groans. Pt cleaned up and readjusted in bed. Hospitalist and palliative care made aware.

## 2020-11-05 NOTE — ED Notes (Addendum)
Some of pts leads lose, pt reconnected to monitor at this time

## 2020-11-05 NOTE — ED Notes (Signed)
Duncan MD at bedside 

## 2020-11-05 NOTE — Consult Note (Addendum)
Consultation Note Date: 11/05/2020   Patient Name: Marie Dunn  DOB: 1946-04-30  MRN: [id number]  Age / Sex: 75 y.o., female  PCP: System, Provider Not In Referring Physician: Max Sane, MD  Reason for Consultation: Establishing goals of care and Psychosocial/spiritual support  HPI/Patient Profile: 75 y.o. female  with past medical history of metastatic squamous cell lung cancer, followed by [facility] oncology, pulmonary fibrosis and emphysema on 2 L home oxygen, HTN, ADHD, left hip replacement admitted on 11/10/2020 with acute hypoxic respiratory failure secondary to Covid pneumonia, elevated cardiac markers consistent with NSTEMI.  Clinical Assessment and Goals of Care: I have reviewed medical records including EPIC notes, labs and imaging, received report from bedside nursing staff, examined the patient and met at bedside with Marie Dunn to discuss diagnosis prognosis, [place], EOL wishes, disposition and options.  I introduced Palliative Medicine as specialized medical care for people living with serious illness. It focuses on providing relief from the symptoms and stress of a serious illness.   Marie Dunn is lying quietly on the stretcher in the ED.  She greets me making and mostly keeping eye contact.  She is alert and oriented x3, able to make her needs known.  There is no family at bedside at this time.  She has BiPAP in place, but nursing staff assist her to remove this to high flow nasal cannula so that we may have a discussion.  Almost immediately, Marie Dunn states that she wants to leave.  We talked about leaving the hospital, I ask you will care for her at home.  She tells me several times that she just needs to go home, drink some water, eat some soup, and then she will decide where she will take medical care.  She tells me that she is seen by oncology and radiation oncology at [facility].  She is shares she is  unsure if that she would return to see her primary care doctor or go to [facility] ED.  I remind her that if she becomes so sick and has to call 911, they will bring her back here, the closest hospital.  We talked about her leaving AMA recently, and then being returned to [facility]  ED.  I ask who would take her to the ED or doctors office.  She states that she has neighbors and church members to help.  I ask about her traveling with friends with now Covid positive results.  She states, "maybe they'll put me in the trunk".   Marie Dunn continues to state that she wants to leave, go home, drink water and eat soup, and then decide how she'll be cared for.  She clearly at this point does not want comfort measures.  She tells me that she recently had testing through [facility] oncology, and "they found no cancer".  I asked Marie Dunn he will pick her up from the hospital.  She states that she is not sure, and she needs her telephone.  I find her telephone in her bag, but the battery  is low and she has no Games developer.  She declines for me to take the phone from the room to charge it.    Marie Dunn states that she thinks the medical team believes she is making bad decisions, but she was a Therapist, music".  I shared that she does have a bad choices.  Multiple times during our conversation, Marie Dunn has dramatic eye rolling.  When questioned about this she states that she is indeed rolling her eyes purposefully.  Marie Dunn states morphine IV helps with her respiratory distress and she would like some.  Nursing staff notified.  Call to son, [name].  Immediately goes to voicemail stating "[name]".  Left somewhat generic voicemail message requesting return call at palliative number.  Call to daughter, [name].  Son he states that Marie Dunn moved to [place] this past Thursday.  She shares that Marie Dunn will at times refused treatments and care.  She states that Marie Dunn does follow-up with [facility] for her  cancer, but she is unsure of any treatment stating the last she understood the cancer was "not growing and no treatments were offered/needed".  [name] states that Marie Dunn is noncompliant at times, not using at home CPAP as she should.  We talked about Marie Dunn desire to leave the hospital.  Su hello this is #palliative nurse practitioner at [facility] there is no problems or emergencies this is J yeah I spoke with your sister [name] earlier in the day and then I got a call that said that she wanted to touch base with me for [name] ask what questions do you have for appropriate verbal medical nny shares that she doubts that [facility] would accept her back as she is Covid positive.  At this point [name] states that family will not come to pick up Marie Dunn from the ED.   Conference with attending, bedside nursing staff, transition of care team related to patient condition, needs, goals of care.   Addendum:    Return phone call from son, [name].  We talk about Marie Dunn covid PNE, the treatment plan.  We also talked about Marie Dunn desire to leave, but no safe discharge plan, has no family/friends willing to provide transportation.  J shares that his concern is that his mother would return to her ALF and pass away there.  I share that, at this point, she is not being discharged, and there is no one to help her leave AMA.  We plan for daily follow-ups.  HCPOA    HCPOA - Daughter [name] shares that son, [name] is HCPOA.  They also have a sister, [name](?).    SUMMARY OF RECOMMENDATIONS   At this point continue to treat the treatable but no CPR or intubation Would like to leave, but has no transportation. EMS would not transport her home alone in her current condition   Code Status/Advance Care Planning:  DNR  Symptom Management:   Per hospitalist, no additional needs at this time.  Palliative Prophylaxis:   Frequent Pain Assessment and Oral Care  Additional Recommendations  (Limitations, Scope, Preferences):  Treat the treatable but no CPR or intubation.  Psycho-social/Spiritual:   Desire for further Chaplaincy support:no  Additional Recommendations: Caregiving  Support/Resources and Education on Hospice  Prognosis:   Unable to determine, guarded at this point.  Due to poor functional status, frailty, chronic illness burden.  It would not be surprising if Marie Dunn does not survive this hospital stay.  Discharge Planning: To be determined, based on outcomes.      Primary Diagnoses: Present on Admission: . Pneumonia due to COVID-19 virus   I have reviewed the medical record, interviewed the patient and family, and examined the patient. The following aspects are pertinent.  Past Medical History:  Diagnosis Date  . ADHD (attention deficit hyperactivity disorder)   . Cancer ([place])    lung  . Hypertension    Social History   Socioeconomic History  . Marital status: Divorced    Spouse name: Not on file  . Number of children: Not on file  . Years of education: Not on file  . Highest education level: Not on file  Occupational History  . Not on file  Tobacco Use  . Smoking status: Former Research scientist (life sciences)  . Smokeless tobacco: Never Used  Substance and Sexual Activity  . Alcohol use: No  . Drug use: No  . Sexual activity: Never  Other Topics Concern  . Not on file  Social History Narrative  . Not on file   Social Determinants of Health   Financial Resource Strain: Not on file  Food Insecurity: Not on file  Transportation Needs: Not on file  Physical Activity: Not on file  Stress: Not on file  Social Connections: Not on file   History reviewed. No pertinent family history. Scheduled Meds: . aspirin  81 mg Oral Daily  . atorvastatin  40 mg Oral Daily  . dexamethasone (DECADRON) injection  6 mg Intravenous Q24H  . furosemide  40 mg Intravenous Daily  . lisinopril  10 mg Oral Daily  . multivitamin with minerals  1 tablet Oral Daily    Continuous Infusions: . heparin 650 Units/hr (11/05/20 0324)  . remdesivir 100 mg in NS 100 mL 100 mg (11/05/20 0953)   PRN Meds:.acetaminophen, albuterol, guaiFENesin, melatonin, morphine injection, nitroGLYCERIN, senna-docusate Medications Prior to Admission:  Prior to Admission medications   Medication Sig Start Date End Date Taking? Authorizing Provider  albuterol (PROVENTIL HFA;VENTOLIN HFA) 108 (90 Base) MCG/ACT inhaler Inhale 1-2 puffs into the lungs every 6 (six) hours as needed for wheezing or shortness of breath.   Yes [provider]  Cholecalciferol (VITAMIN D3) 1.25 MG (50000 UT) CAPS Take 50,000 Units by mouth once a week. 09/16/20  Yes [provider]  Dexmethylphenidate HCl 30 MG CP24 Take 30 mg by mouth daily. 10/04/20  Yes [provider]  ferrous sulfate 325 (65 FE) MG tablet Take 325 mg by mouth daily.   Yes [provider]  prochlorperazine (COMPAZINE) 5 MG tablet Take 5 mg by mouth 4 (four) times daily as needed for nausea or vomiting.   Yes [provider]  sertraline (ZOLOFT) 25 MG tablet Take 25 mg by mouth daily. 11/01/20  Yes [provider]  SPIRIVA RESPIMAT 1.25 MCG/ACT AERS Inhale 2 puffs into the lungs daily. 10/19/20  Yes [provider]  SYMBICORT 160-4.5 MCG/ACT inhaler Inhale 2 puffs into the lungs 2 (two) times daily. 10/25/20  Yes [provider]   Allergies  Allergen Reactions  . Ampicillin Anaphylaxis  . Codeine Other (See Comments)  . Morphine And Related Rash    Other Reaction: Intolerance  . Amoxicillin Swelling  . Prednisone Swelling   Review of Systems  Unable to perform ROS: Other    Physical Exam Vitals and nursing note reviewed.  Constitutional:      General: She is not in acute distress.    Appearance: She is ill-appearing.  HENT:  Head: Normocephalic and atraumatic.  Cardiovascular:     Rate and Rhythm: Regular rhythm. Bradycardia present.  Pulmonary:      Comments: Able to make full sentences Skin:    General: Skin is warm and dry.     Findings: Ecchymosis present.  Neurological:     Mental Status: She is alert and oriented to person, place, and time.     Vital Signs: BP (!) 124/59   Pulse (!) 39   Temp 98.4 F (36.9 C)   Resp (!) 22   Ht 5' 1"  (1.549 m)   Wt 43.1 kg   SpO2 97%   BMI 17.95 kg/m  Pain Scale: 0-10   Pain Score: 4    SpO2: SpO2: 97 % O2 Device:SpO2: 97 % O2 Flow Rate: .O2 Flow Rate (L/min): 6 L/min  IO: Intake/output summary: No intake or output data in the 24 hours ending 11/05/20 0955  LBM:   Baseline Weight: Weight: 43.1 kg Most recent weight: Weight: 43.1 kg     Palliative Assessment/Data:   Flowsheet Rows   Flowsheet Row Most Recent Value  Intake Tab   Referral Department Hospitalist  Unit at Time of Referral Intermediate Care Unit  Palliative Care Primary Diagnosis Pulmonary  Date Notified 10/30/2020  Palliative Care Type New Palliative care  Reason for referral Clarify Goals of Care  Date of Admission 10/16/2020  Date first seen by Palliative Care 11/05/20  # of days Palliative referral response time 1 Day(s)  # of days IP prior to Palliative referral 0  Clinical Assessment   Palliative Performance Scale Score 30%  Pain Max last 24 hours Not able to report  Pain Min Last 24 hours Not able to report  Dyspnea Max Last 24 Hours Not able to report  Dyspnea Min Last 24 hours Not able to report  Psychosocial & Spiritual Assessment   Palliative Care Outcomes       Time In: 0910 Time Out: 1020 Time Total: 70 minutes Greater than 50%  of this time was spent counseling and coordinating care related to the above assessment and plan.  Signed by: Drue Novel, NP   Please contact Palliative Medicine Team phone at [phone] for questions and concerns.  For individual provider: See Shea Evans

## 2020-11-05 NOTE — ED Notes (Signed)
Pt tolerating bipap well, awaiting hospital bed availability.

## 2020-11-05 NOTE — ED Notes (Signed)
Report received from Russell RN. Patient care assumed. Patient/RN introduction complete. Will continue to monitor.  

## 2020-11-06 ENCOUNTER — Inpatient Hospital Stay: Payer: Medicare Other

## 2020-11-06 DIAGNOSIS — E43 Unspecified severe protein-calorie malnutrition: Secondary | ICD-10-CM

## 2020-11-06 DIAGNOSIS — U071 COVID-19: Secondary | ICD-10-CM | POA: Diagnosis not present

## 2020-11-06 DIAGNOSIS — R627 Adult failure to thrive: Secondary | ICD-10-CM | POA: Diagnosis not present

## 2020-11-06 DIAGNOSIS — J9601 Acute respiratory failure with hypoxia: Secondary | ICD-10-CM | POA: Diagnosis not present

## 2020-11-06 DIAGNOSIS — Z515 Encounter for palliative care: Secondary | ICD-10-CM | POA: Diagnosis not present

## 2020-11-06 LAB — CBC WITH DIFFERENTIAL/PLATELET
Abs Immature Granulocytes: 0.07 10*3/uL (ref 0.00–0.07)
Basophils Absolute: 0.1 10*3/uL (ref 0.0–0.1)
Basophils Relative: 0 %
Eosinophils Absolute: 0 10*3/uL (ref 0.0–0.5)
Eosinophils Relative: 0 %
HCT: 40.7 % (ref 36.0–46.0)
Hemoglobin: 12 g/dL (ref 12.0–15.0)
Immature Granulocytes: 1 %
Lymphocytes Relative: 3 %
Lymphs Abs: 0.4 10*3/uL — ABNORMAL LOW (ref 0.7–4.0)
MCH: 25.6 pg — ABNORMAL LOW (ref 26.0–34.0)
MCHC: 29.5 g/dL — ABNORMAL LOW (ref 30.0–36.0)
MCV: 86.8 fL (ref 80.0–100.0)
Monocytes Absolute: 0.3 10*3/uL (ref 0.1–1.0)
Monocytes Relative: 2 %
Neutro Abs: 12.9 10*3/uL — ABNORMAL HIGH (ref 1.7–7.7)
Neutrophils Relative %: 94 %
Platelets: 436 10*3/uL — ABNORMAL HIGH (ref 150–400)
RBC: 4.69 MIL/uL (ref 3.87–5.11)
RDW: 18.6 % — ABNORMAL HIGH (ref 11.5–15.5)
Smear Review: NORMAL
WBC: 13.7 10*3/uL — ABNORMAL HIGH (ref 4.0–10.5)
nRBC: 0.1 % (ref 0.0–0.2)

## 2020-11-06 LAB — BASIC METABOLIC PANEL
Anion gap: 14 (ref 5–15)
BUN: 64 mg/dL — ABNORMAL HIGH (ref 8–23)
CO2: 28 mmol/L (ref 22–32)
Calcium: 8.1 mg/dL — ABNORMAL LOW (ref 8.9–10.3)
Chloride: 103 mmol/L (ref 98–111)
Creatinine, Ser: 1.3 mg/dL — ABNORMAL HIGH (ref 0.44–1.00)
GFR, Estimated: 42 mL/min — ABNORMAL LOW (ref >60)
Glucose, Bld: 102 mg/dL — ABNORMAL HIGH (ref 70–99)
Potassium: 3.9 mmol/L (ref 3.5–5.1)
Sodium: 145 mmol/L (ref 135–145)

## 2020-11-06 LAB — C-REACTIVE PROTEIN: CRP: 20.4 mg/dL — ABNORMAL HIGH (ref <1.0)

## 2020-11-06 LAB — HEPARIN LEVEL (UNFRACTIONATED): Heparin Unfractionated: 0.16 IU/mL — ABNORMAL LOW (ref 0.30–0.70)

## 2020-11-06 LAB — MAGNESIUM: Magnesium: 2.3 mg/dL (ref 1.7–2.4)

## 2020-11-06 MED ORDER — MORPHINE SULFATE (PF) 2 MG/ML IV SOLN
INTRAVENOUS | Status: AC
  Filled 2020-11-06: qty 1

## 2020-11-06 MED ORDER — HALOPERIDOL LACTATE 2 MG/ML PO CONC
0.5000 mg | ORAL | Status: DC | PRN
  Filled 2020-11-06: qty 0.3

## 2020-11-06 MED ORDER — MORPHINE SULFATE (PF) 2 MG/ML IV SOLN: 1.0000 mg | INTRAVENOUS | Status: DC | PRN

## 2020-11-06 MED ORDER — ONDANSETRON HCL 4 MG/2ML IJ SOLN: 4.0000 mg | Freq: Four times a day (QID) | INTRAMUSCULAR | Status: DC | PRN

## 2020-11-06 MED ORDER — ACETAMINOPHEN 650 MG RE SUPP: 650.0000 mg | Freq: Four times a day (QID) | RECTAL | Status: DC | PRN

## 2020-11-06 MED ORDER — ACETAMINOPHEN 325 MG PO TABS: 650.0000 mg | ORAL_TABLET | Freq: Four times a day (QID) | ORAL | Status: DC | PRN

## 2020-11-06 MED ORDER — HALOPERIDOL 0.5 MG PO TABS
0.5000 mg | ORAL_TABLET | ORAL | Status: DC | PRN
  Filled 2020-11-06: qty 1

## 2020-11-06 MED ORDER — MORPHINE SULFATE (PF) 2 MG/ML IV SOLN
2.0000 mg | Freq: Once | INTRAVENOUS | Status: AC
  Administered 2020-11-06: 2 mg via INTRAVENOUS

## 2020-11-06 MED ORDER — ONDANSETRON 4 MG PO TBDP: 4.0000 mg | ORAL_TABLET | Freq: Four times a day (QID) | ORAL | Status: DC | PRN

## 2020-11-06 MED ORDER — BIOTENE DRY MOUTH MT LIQD
15.0000 mL | OROMUCOSAL | Status: DC | PRN
  Filled 2020-11-06: qty 15

## 2020-11-06 MED ORDER — GLYCOPYRROLATE 0.2 MG/ML IJ SOLN
0.2000 mg | INTRAMUSCULAR | Status: DC | PRN
  Filled 2020-11-06: qty 1

## 2020-11-06 MED ORDER — GLYCOPYRROLATE 1 MG PO TABS
1.0000 mg | ORAL_TABLET | ORAL | Status: DC | PRN
  Filled 2020-11-06: qty 1

## 2020-11-06 MED ORDER — MORPHINE SULFATE (PF) 2 MG/ML IV SOLN
2.0000 mg | INTRAVENOUS | Status: DC | PRN
  Filled 2020-11-06: qty 1

## 2020-11-06 MED ORDER — HALOPERIDOL LACTATE 5 MG/ML IJ SOLN: 0.5000 mg | INTRAMUSCULAR | Status: DC | PRN

## 2020-11-06 MED ORDER — POLYVINYL ALCOHOL 1.4 % OP SOLN
1.0000 [drp] | Freq: Four times a day (QID) | OPHTHALMIC | Status: DC | PRN
  Filled 2020-11-06: qty 15

## 2020-11-06 MED ORDER — MORPHINE 100MG IN NS 100ML (1MG/ML) PREMIX INFUSION
2.0000 mg/h | INTRAVENOUS | Status: DC
  Administered 2020-11-06: 1 mg/h via INTRAVENOUS
  Administered 2020-11-06: 2 mg/h via INTRAVENOUS
  Administered 2020-11-06: 1 mg/h via INTRAVENOUS
  Filled 2020-11-06: qty 100

## 2020-11-12 NOTE — Consult Note (Signed)
Patient presenting to the emergency department with worsening dyspnea and cough. Patient with a PMH of metastatic squamous cell lung cancer, pulmonary fibrosis, and COPD. Patient found to have COVID 19 infection. We were consulted for elevated troponins which were most likely from d/t demand ischemia from hypoxia. Her condition worsened overnight and has now been made comfort care. Will sign off at this time. If there are any further questions please let me know. Thanks.  Adaline Sill NP-C

## 2020-11-12 NOTE — ED Notes (Signed)
Daughter at bedside. MD made aware.

## 2020-11-12 NOTE — ED Notes (Signed)
This RN at bedside to answer call bell. Pt with same requests as previous encounter. Pt NRB off and on bed. Oxygen sats in 75-80s. This RN explained to pt the importance of keeping the NRB on due to low oxygen.

## 2020-11-12 NOTE — ED Notes (Signed)
This RN and Terri Piedra, RN was instructed by Damita Dunnings, MD to contact family about pt recent changes. Pt has become more lethargic. Pt had a decrease in O2 sat and a decrease response to pain and verbal stimuli. This RN and Terri Piedra, RN was instructed to contact RT to see if there was anything that could be done to make pt comfortable. RT suggested bipap. This RN will speak to family about pt wishes, since pt is DNR.

## 2020-11-12 NOTE — Progress Notes (Signed)
  Chaplain On-Call received telephone call from patient's daughter [name] at [phone] with the request for a [name] to provide the Anointing of the Sick for the patient.  Chaplain contacted [employer] and spoke with Office Manager Jana Half, who stated that she would contact the [place] with the request. Chaplain reported this information to [place] at [phone].  Chaplain visited with the patient's family at patient's bedside at 1030. Chaplain met [name]; her sister, and her brother.  The family stated that Father Evette Doffing from [facility] had visited with them and has Anointed the patient.  Chaplain provided spiritual and emotional support.  [place] Marie Dunn M.Div., [facility]

## 2020-11-12 NOTE — ED Notes (Addendum)
Pt and family still remain in ED at this time. Waiting for room to be cleaned. Family provided food and drinks at this time

## 2020-11-12 NOTE — ED Notes (Addendum)
Mimi RN at bedside with this RN. Pt with no cardiac activity. TOD at 1712.

## 2020-11-12 NOTE — ED Notes (Signed)
MD made this RN aware pt is now comfort care and comfort measures will be initiated.

## 2020-11-12 NOTE — Progress Notes (Signed)
[facility] [place] NAME: Marie Dunn    MR#:  [id number]  DATE OF BIRTH:  1946-02-21  SUBJECTIVE:   Patient seen in the emergency room earlier. Daughter at bedside. Patient is gasping and air hunger. Open eyes to verbal command. No further communication could be held. Overall looks critically ill.  Patient was earlier made comfort care after discussion was held by Dr. Damita Dunnings with patient's son given poor prognosis. REVIEW OF SYSTEMS:   Review of Systems  Unable to perform ROS: Medical condition     DRUG ALLERGIES:   Allergies  Allergen Reactions  . Ampicillin Anaphylaxis  . Codeine Other (See Comments)  . Morphine And Related Rash    Other Reaction: Intolerance  . Amoxicillin Swelling  . Prednisone Swelling    VITALS:  Blood pressure 117/65, pulse (!) 110, temperature 99 F (37.2 C), temperature source Axillary, resp. rate (!) 26, height 5\' 1"  (1.549 m), weight 43.1 kg, SpO2 (!) 47 %.  PHYSICAL EXAMINATION:   Physical Exam limited on comfort care. GENERAL:  75 y.o.-year-old patient lying in the bed with gasping for air. Daughter at bedside. Thin cachectic critically ill LUNGS: shallow breath sounds bilaterally, no wheezing, rales, rhonchi. No use of accessory muscles of respiration.  CARDIOVASCULAR: S1, S2 normal. No murmurs, rubs, or gallops.  PSYCHIATRIC:  patient is lethargic   LABORATORY PANEL:  CBC Recent Labs  Lab 11-17-20 0402  WBC 13.7*  HGB 12.0  HCT 40.7  PLT 436*    Chemistries  Recent Labs  Lab 11-17-20 0402  NA 145  K 3.9  CL 103  CO2 28  GLUCOSE 102*  BUN 64*  CREATININE 1.30*  CALCIUM 8.1*  MG 2.3   Cardiac Enzymes No results for input(s): TROPONINI in the last 168 hours. RADIOLOGY:  DG Chest 2 View  Result Date: 11/03/2020 CLINICAL DATA:  Short of breath EXAM: CHEST - 2 VIEW COMPARISON:  10/29/2020 FINDINGS: Progression of widespread bilateral airspace disease. Possible minimal  pleural effusion bilaterally Cardiac enlargement.  Vascularity does not appear congested. IMPRESSION: Extensive diffuse bilateral airspace disease. Probable COVID pneumonia. Correlate with symptoms of heart failure and edema. Electronically Signed   By: Franchot Gallo M.D.   On: 11/01/2020 13:04   DG Chest Port 1 View  Result Date: 11-17-20 CLINICAL DATA:  Pneumonia.  COVID positive. EXAM: PORTABLE CHEST 1 VIEW COMPARISON:  10/21/2020.  10/29/2020.  12/03/2016. FINDINGS: Cardiomegaly. Diffuse dense bilateral pulmonary infiltrates, progressed from prior exam. Probable underlying chronic interstitial disease. Dense left base atelectasis. No prominent pleural effusion. No pneumothorax. Stable cardiomegaly. Degenerative changes scoliosis thoracic spine. IMPRESSION: 1. Stable cardiomegaly. 2. Diffuse dense bilateral pulmonary infiltrates, progressed from prior exam. Findings consistent with COVID pneumonia. Dense left base atelectasis. Probable underlying chronic interstitial disease. Electronically Signed   By: Marcello Moores  Register   On: 2020/11/17 06:33   ECHOCARDIOGRAM COMPLETE  Result Date: 11/02/2020    ECHOCARDIOGRAM REPORT   [name] Date of Exam: 11/07/2020 Medical Rec #:  [id number]       Height:       61.0 in Accession #:    [id number]      Weight:       95.0 lb Date of Birth:  1946/08/29        BSA:          1.376 m Patient Age:    75 years        BP:  153/126 mmHg Patient Gender: F               HR:           99 bpm. Exam Location:  [facility] Procedure: 2D Echo, Cardiac Doppler and Color Doppler Indications:     [place] respiratory distress R06.03  History:         Patient has no prior history of Echocardiogram examinations.                  Risk Factors:Hypertension.  Sonographer:     Alyse Low Roar Referring Phys:  [id number] [place] Diagnosing Phys: Neoma Laming MD IMPRESSIONS  1. Left ventricular ejection fraction, by estimation, is 35 to 40%. The left ventricle has  moderately decreased function. The left ventricle demonstrates global hypokinesis. The left ventricular internal cavity size was moderately dilated. There is mild  left ventricular hypertrophy. Left ventricular diastolic parameters are consistent with Grade I diastolic dysfunction (impaired relaxation).  2. Right ventricular systolic function is moderately reduced. The right ventricular size is moderately enlarged.  3. Left atrial size was severely dilated.  4. Right atrial size was severely dilated.  5. The mitral valve is normal in structure. Mild mitral valve regurgitation. No evidence of mitral stenosis.  6. Tricuspid valve regurgitation is moderate to severe.  7. The aortic valve is calcified. Aortic valve regurgitation is not visualized. Mild aortic valve sclerosis is present, with no evidence of aortic valve stenosis.  8. The inferior vena cava is normal in size with greater than 50% respiratory variability, suggesting right atrial pressure of 3 mmHg. FINDINGS  Left Ventricle: Left ventricular ejection fraction, by estimation, is 35 to 40%. The left ventricle has moderately decreased function. The left ventricle demonstrates global hypokinesis. The left ventricular internal cavity size was moderately dilated. There is mild left ventricular hypertrophy. Left ventricular diastolic parameters are consistent with Grade I diastolic dysfunction (impaired relaxation). Right Ventricle: The right ventricular size is moderately enlarged. No increase in right ventricular wall thickness. Right ventricular systolic function is moderately reduced. Left Atrium: Left atrial size was severely dilated. Right Atrium: Right atrial size was severely dilated. Pericardium: There is no evidence of pericardial effusion. Mitral Valve: The mitral valve is normal in structure. Mild mitral valve regurgitation. No evidence of mitral valve stenosis. Tricuspid Valve: The tricuspid valve is normal in structure. Tricuspid valve regurgitation  is moderate to severe. No evidence of tricuspid stenosis. Aortic Valve: The aortic valve is calcified. Aortic valve regurgitation is not visualized. Mild aortic valve sclerosis is present, with no evidence of aortic valve stenosis. Aortic valve peak gradient measures 5.2 mmHg. Pulmonic Valve: The pulmonic valve was normal in structure. Pulmonic valve regurgitation is not visualized. No evidence of pulmonic stenosis. Aorta: The aortic root is normal in size and structure. Venous: The inferior vena cava is normal in size with greater than 50% respiratory variability, suggesting right atrial pressure of 3 mmHg. IAS/Shunts: No atrial level shunt detected by color flow Doppler.  LEFT VENTRICLE PLAX 2D LVIDd:         4.24 cm     Diastology LVIDs:         3.38 cm     LV e' medial:    2.72 cm/s LV PW:         1.24 cm     LV E/e' medial:  25.8 LV IVS:        1.37 cm     LV e' lateral:   4.13 cm/s LVOT  diam:     1.90 cm     LV E/e' lateral: 17.0 LVOT Area:     2.84 cm  LV Volumes (MOD) LV vol d, MOD A2C: 69.5 ml LV vol d, MOD A4C: 88.5 ml LV vol s, MOD A2C: 41.7 ml LV vol s, MOD A4C: 53.7 ml LV SV MOD A2C:     27.8 ml LV SV MOD A4C:     88.5 ml LV SV MOD BP:      31.3 ml RIGHT VENTRICLE RV Mid diam:    3.43 cm RV S prime:     8.70 cm/s TAPSE (M-mode): 1.6 cm LEFT ATRIUM             Index       RIGHT ATRIUM           Index LA diam:        3.75 cm 2.72 cm/m  RA Area:     16.00 cm LA Vol (A2C):   50.9 ml 36.98 ml/m RA Volume:   41.80 ml  30.37 ml/m LA Vol (A4C):   72.0 ml 52.31 ml/m LA Biplane Vol: 63.1 ml 45.84 ml/m  AORTIC VALVE                PULMONIC VALVE AV Area (Vmax): 1.74 cm    PV Vmax:        0.56 m/s AV Vmax:        114.00 cm/s PV Peak grad:   1.2 mmHg AV Peak Grad:   5.2 mmHg    RVOT Peak grad: 1 mmHg LVOT Vmax:      69.80 cm/s  AORTA Ao Root diam: 2.80 cm MITRAL VALVE                TRICUSPID VALVE MV Area (PHT): 5.13 cm     TR Peak grad:   44.4 mmHg MV Decel Time: 148 msec     TR Vmax:        333.00 cm/s  MV E velocity: 70.30 cm/s MV A velocity: 110.00 cm/s  SHUNTS MV E/A ratio:  0.64         Systemic Diam: 1.90 cm MV A Prime:    6.3 cm/s Neoma Laming MD Electronically signed by Neoma Laming MD Signature Date/Time: 11/03/2020/8:50:06 PM    Final    ASSESSMENT AND PLAN:  75 y.o.femalewith K/H/O metastatic squamous cell lung cancer, followed by [facility] oncology and being treated with pembrolizumab, pulmonary fibrosis and emphysema on 2L/min of oxygen at home is admitted for acute hypoxic resp failure due to covid pna   Acute on chronic hypoxic resp failure due to covid pneumonia with underlying pulmo fibrosis and emphysema -Was on BiPAP but she kept removing and transitioned to HFNC and not rebreather. --Patient overall declined. Dr. Damita Dunnings discussed with patient's son early hours of the morning given overall declining condition. She was made comfort care. -- Daughter in the ER at bedside. Understands poor prognosis. -- Continue comfort care. Currently on morphine drip.  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   Chronic essential hypertension:   History of mild ADHD  Severe protein calorie malnutrition Body mass index is 17.95 kg/m.   Status is: Inpatient  Remains inpatient appropriate because:Inpatient level  of care appropriate due to severity of illness   Dispo: The patient is from: Home   Anticipated d/c date is: patient is on comfort care is actively dying. Likely in hospital death.  Patient currently is not medically stable to d/c.  DVT prophylaxis:       on comfort care    Family Communication: updated patient's daughter at bedside in the ER.       TOTAL TIME  TAKING CARE OF THIS PATIENT: *20* minutes.  >50% time spent on counselling and coordination of care  Note: This dictation was prepared with Dragon dictation along with smaller phrase technology. Any transcriptional errors that result from this process are unintentional.  Fritzi Mandes M.D    [facility]   CC: Primary care physician; System, Provider Not InPatient ID: Marie Dunn, female   DOB: 09-Jun-1946, 75 y.o.   MRN: [id number]

## 2020-11-12 NOTE — ED Notes (Signed)
NP at bedside.

## 2020-11-12 NOTE — ED Notes (Signed)
Notified family of pt changes and informed Damita Dunnings, MD about family's concern and decisions.   Damita Dunnings, MD reply:  ok, thanks. still get respiratory on board. call them to do an assessment. If she worsens in spite of everything, then I will give him a call, but at least for now, we got him thinking about it. thanksok, thanks. still get respiratory on board. call them to do an assessment. If she worsens in spite of everything, then I will give him a call, but at least for now, we got him thinking about it. thanks

## 2020-11-12 NOTE — Progress Notes (Signed)
[place] for heparin Indication: chest pain/ACS  Allergies  Allergen Reactions  . Ampicillin Anaphylaxis  . Codeine Other (See Comments)  . Morphine And Related Rash    Other Reaction: Intolerance  . Amoxicillin Swelling  . Prednisone Swelling    Patient Measurements: Height: 5\' 1"  (154.9 cm) Weight: 43.1 kg (95 lb) IBW/kg (Calculated) : 47.8 Heparin Dosing Weight: 43 kg  Vital Signs: Temp: 99 F (37.2 C) (01/25 1711) Temp Source: Axillary (01/25 1711) BP: 99/84 (01/26 0006) Pulse Rate: 97 (01/26 0006)  Labs: Recent Labs    11/07/2020 1242 11/08/2020 1359 10/30/2020 1420 10/17/2020 2300 11/05/20 0500 11/05/20 1358 11-24-20 0124  HGB 12.3  --   --   --  11.7*  --   --   HCT 39.2  --   --   --  39.2  --   --   PLT 504*  --   --   --  446*  --   --   APTT  --  31  --   --   --   --   --   LABPROT  --  15.9*  --   --   --   --   --   INR  --  1.3*  --   --   --   --   --   HEPARINUNFRC  --   --   --  <0.10*  --  <0.10* 0.16*  CREATININE 0.67  --   --   --   --   --   --   TROPONINIHS 182*  --  181*  --   --   --   --     Estimated Creatinine Clearance: 40.1 mL/min (by C-G formula based on SCr of 0.67 mg/dL).   Medical History: Past Medical History:  Diagnosis Date  . ADHD (attention deficit hyperactivity disorder)   . Cancer ([place])    lung  . Hypertension      Assessment: 75 year old female admitted with SOB. No anticoagulation identified PTA. Pharmacy consult for heparin drip.  Goal of Therapy:  Heparin level 0.3-0.7 units/ml Monitor platelets by anticoagulation protocol: Yes   01/24 2300 HL < 0.10 01/25 1358 HL < 0.10 01/26 0124 HL = 0.16    Plan:  Will DC Heparin Consult.  MD stopped heparin infusion and transitioned pt to Lovenox Prophylaxis.  Pharmacy will continue to follow SCr and Plt.  Renda Rolls, PharmD, MBA 2020-11-24 3:00 AM

## 2020-11-12 NOTE — ED Notes (Signed)
This RN at bedside to answer call bell. Pt stating she wants to get on the side of the bed and wants water. This RN explaining that for safety pt needs to stay in bed. This RN also explaining pt unable to have water at this time due to pt being NPO and aspiration risk

## 2020-11-12 NOTE — ED Notes (Signed)
Pt resting in bed at this time, o2 devices on pt. Pt being monitored,.

## 2020-11-12 NOTE — ED Notes (Signed)
Respiratory was at bedside and suggested that we keep pt on non rebreather.

## 2020-11-12 NOTE — ED Notes (Signed)
This RN and Risk manager bagged pt and told Network engineer, Holley Raring to call for transport for pt to go down to the morgue. Pt still has on gold bracelets and bottom dentures in. Other belonging sent with pt in belonging bag.

## 2020-11-12 NOTE — Progress Notes (Signed)
Palliative: Mrs. Heiner is lying quietly on the stretcher in the ED.  She is surrounded by her family.  She has been made comfort care.  She does not respond to voice or touch in any meaningful way.  We talked about the benefits of continued infusion of morphine for breathlessness, increased respiratory rate/WOB.  Family is agreement for increased symptom management.  We talked about prolonging the dying process with use of oxygen.  Family is agreeable to reduce oxygen as Mrs. Wilhelmsen is made more comfortable with continuous infusion of morphine.  Conference with attending, bedside nursing staff, transition of care team related to patient condition, needs, goals of care.  Plan: Comfort and dignity at end-of-life, let nature take its course Prognosis:   Hours anticipated, in hospital death.  33 minutes  Quinn Axe, NP Palliative medicine team Team phone [phone] Greater than 50% of this time was spent counseling and coordinating care related to the above assessment and plan.   It is [place].

## 2020-11-12 NOTE — ED Notes (Signed)
SBAR complete. Waiting for room to be cleaned before transporting pt to room

## 2020-11-12 NOTE — Progress Notes (Signed)
Night coverage  Patient's condition deteriorated overnight. Currently on HFNC and 100% nonrebreather, struggling to breathe, gurgly, very restless difficult to get O2 sats. Follow-up chest x-ray showing worsening could not tolerate BiPAP. Spoke with son at around 6:30 AM who has agreed to place his mother comfort care . Hopes to be able to come in in the early afternoon.  Worsening respiratory failure -placed comfort care

## 2020-11-12 DEATH — deceased

## 2020-11-27 LAB — BLOOD GAS, VENOUS
Acid-Base Excess: 1.4 mmol/L (ref 0.0–2.0)
Patient temperature: 37
pCO2, Ven: 43 mmHg — ABNORMAL LOW (ref 44.0–60.0)
pH, Ven: 7.4 (ref 7.250–7.430)

## 2022-02-09 IMAGING — DX DG CHEST 1V PORT
1 series · 1 of 1 positions shown · non-contrast
Comparison: CT chest dated 03/29/2018

CLINICAL DATA: Weakness

EXAM:
PORTABLE CHEST 1 VIEW

[chest ap]
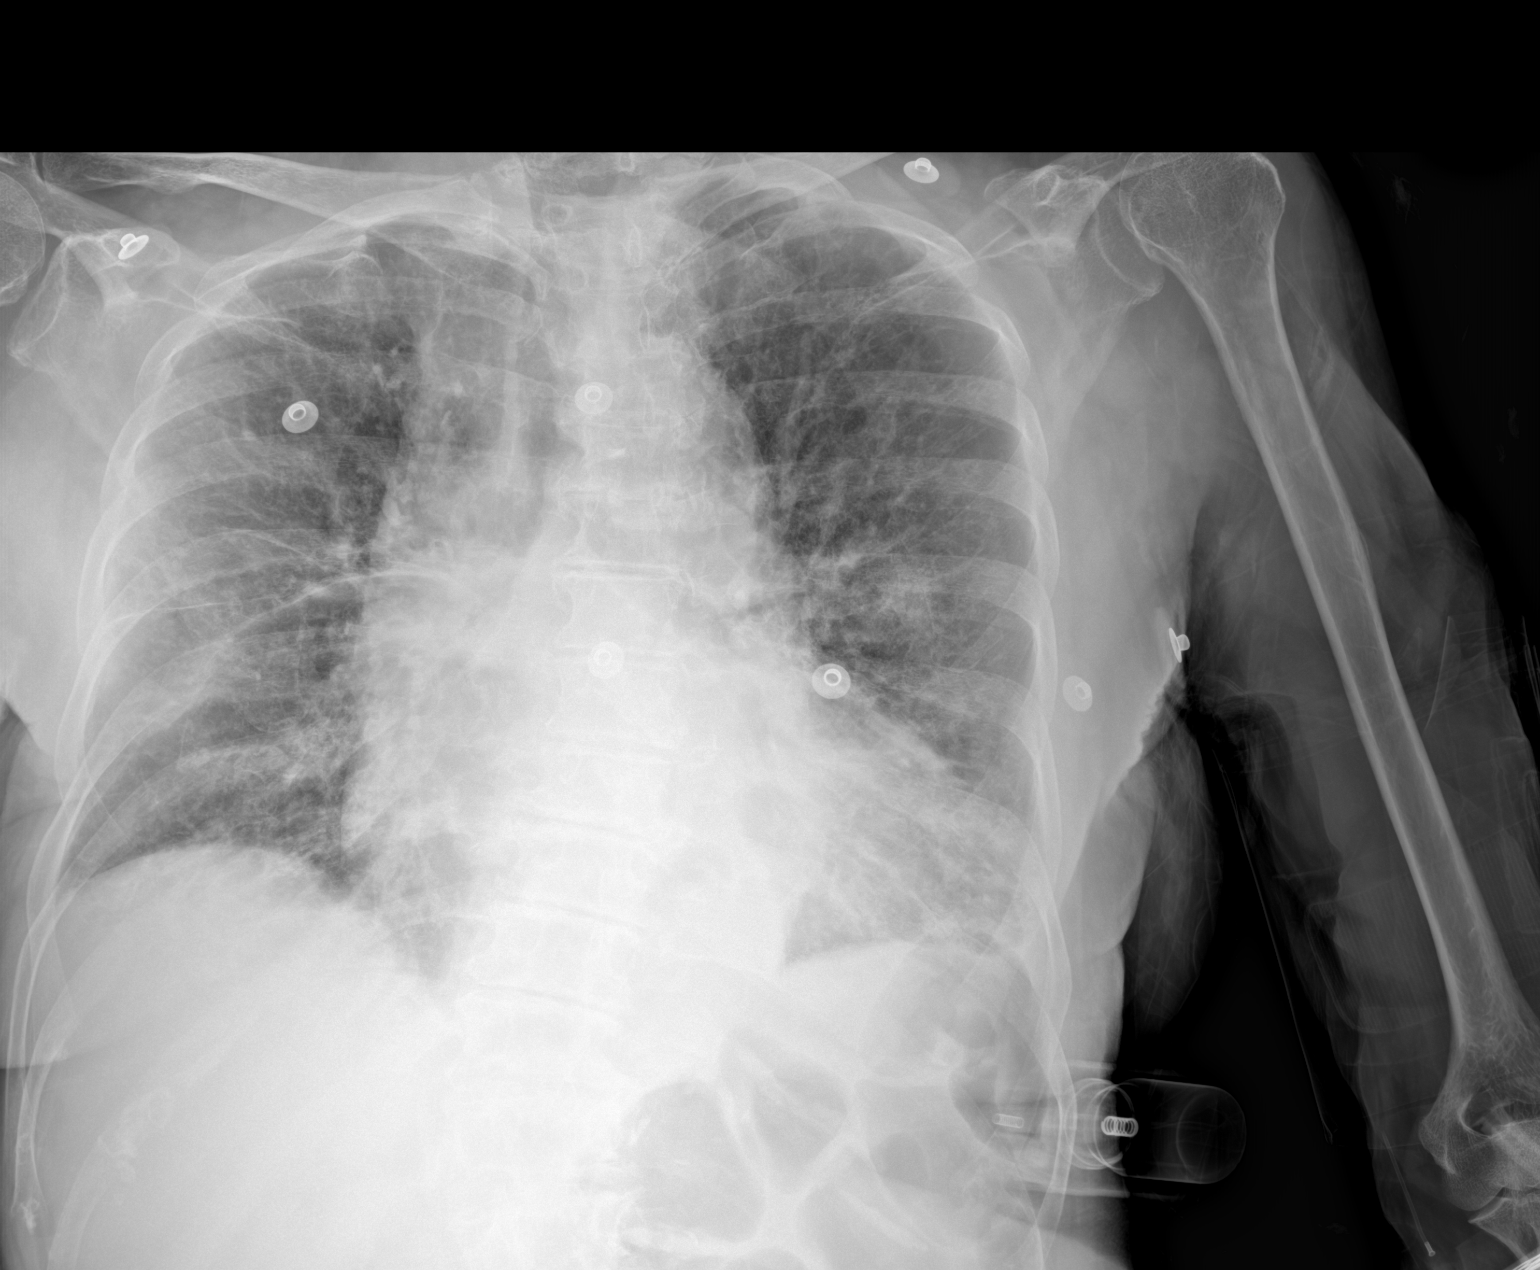

[1 of 1 positions shown; findings below may reference images not displayed]

FINDINGS: Increased interstitial markings. Mild subpleural patchy/nodular
opacities in the bilateral upper lobes and left lower lobe, likely
chronic. Mild bibasilar scarring. Superimposed mild left lower lobe
opacity/pneumonia is possible but is not favored. No pleural
effusion or pneumothorax.

Cardiomegaly.
IMPRESSION: Chronic scarring in the lungs bilaterally. Superimposed
infection/pneumonia is possible but is not favored.
